# Patient Record
Sex: Female | Born: 1975
Health system: Southern US, Community
[De-identification: ages and names within clinical notes are randomized; demographics above are authoritative.]

## PROBLEM LIST (undated history)

## (undated) HISTORY — PX: NO PAST SURGERIES: SHX2092

---

## 2005-08-15 ENCOUNTER — Observation Stay: Payer: Self-pay

## 2005-08-18 ENCOUNTER — Inpatient Hospital Stay: Payer: Self-pay | Admitting: Certified Nurse Midwife

## 2006-12-29 ENCOUNTER — Ambulatory Visit: Payer: Self-pay

## 2007-08-11 ENCOUNTER — Observation Stay: Payer: Self-pay | Admitting: Obstetrics and Gynecology

## 2007-08-28 ENCOUNTER — Observation Stay: Payer: Self-pay | Admitting: Certified Nurse Midwife

## 2007-08-30 ENCOUNTER — Inpatient Hospital Stay: Payer: Self-pay

## 2008-04-11 ENCOUNTER — Ambulatory Visit: Payer: Self-pay | Admitting: Internal Medicine

## 2015-05-31 ENCOUNTER — Encounter: Payer: Self-pay | Admitting: Internal Medicine

## 2015-05-31 ENCOUNTER — Ambulatory Visit (INDEPENDENT_AMBULATORY_CARE_PROVIDER_SITE_OTHER): Payer: BLUE CROSS/BLUE SHIELD | Admitting: Internal Medicine

## 2015-05-31 VITALS — BP 102/58 | HR 81 | Temp 98.1°F | Ht 65.5 in | Wt 120.4 lb

## 2015-05-31 DIAGNOSIS — A499 Bacterial infection, unspecified: Secondary | ICD-10-CM | POA: Diagnosis not present

## 2015-05-31 DIAGNOSIS — G43009 Migraine without aura, not intractable, without status migrainosus: Secondary | ICD-10-CM | POA: Insufficient documentation

## 2015-05-31 DIAGNOSIS — N760 Acute vaginitis: Secondary | ICD-10-CM

## 2015-05-31 DIAGNOSIS — Z309 Encounter for contraceptive management, unspecified: Secondary | ICD-10-CM

## 2015-05-31 DIAGNOSIS — A6000 Herpesviral infection of urogenital system, unspecified: Secondary | ICD-10-CM

## 2015-05-31 DIAGNOSIS — A609 Anogenital herpesviral infection, unspecified: Secondary | ICD-10-CM

## 2015-05-31 DIAGNOSIS — L723 Sebaceous cyst: Secondary | ICD-10-CM | POA: Diagnosis not present

## 2015-05-31 DIAGNOSIS — E782 Mixed hyperlipidemia: Secondary | ICD-10-CM | POA: Insufficient documentation

## 2015-05-31 DIAGNOSIS — J019 Acute sinusitis, unspecified: Secondary | ICD-10-CM

## 2015-05-31 DIAGNOSIS — K219 Gastro-esophageal reflux disease without esophagitis: Secondary | ICD-10-CM | POA: Insufficient documentation

## 2015-05-31 DIAGNOSIS — B9689 Other specified bacterial agents as the cause of diseases classified elsewhere: Secondary | ICD-10-CM

## 2015-05-31 MED ORDER — AZITHROMYCIN 250 MG PO TABS: ORAL_TABLET | ORAL | Status: DC

## 2015-05-31 MED ORDER — NORGESTIMATE-ETH ESTRADIOL 0.25-35 MG-MCG PO TABS: 1.0000 | ORAL_TABLET | Freq: Every day | ORAL | Status: DC

## 2015-05-31 MED ORDER — METRONIDAZOLE 0.75 % VA GEL: 1.0000 | Freq: Two times a day (BID) | VAGINAL | Status: DC

## 2015-05-31 MED ORDER — VALACYCLOVIR HCL 1 G PO TABS: 1000.0000 mg | ORAL_TABLET | Freq: Every day | ORAL | Status: DC

## 2015-05-31 NOTE — Progress Notes (Signed)
Date:  05/31/2015   Name:  Candace Rangel   DOB:  September 29, 1975   MRN:  [id number]   Chief Complaint: Sore Throat Sore Throat  This is a new problem. The current episode started in the past 7 days. The problem has been unchanged. There has been no fever. Associated symptoms include congestion and headaches. Pertinent negatives include no abdominal pain or coughing. Exposure to: children have been sick with cold symptoms. She has tried acetaminophen for the symptoms.  Rash This is a chronic problem. The problem is unchanged. The affected locations include the scalp (large cyst that has been present for years). Associated symptoms include congestion and a sore throat. Pertinent negatives include no cough, fatigue or fever.  Vaginal Itching The patient's primary symptoms include genital itching. The patient's pertinent negatives include no genital lesions, genital rash or vaginal discharge. This is a new problem. The current episode started in the past 7 days. Associated symptoms include headaches, rash and a sore throat. Pertinent negatives include no abdominal pain, chills, dysuria or fever. Treatments tried: vagisil. The treatment provided no relief. She is sexually active. Her past medical history is significant for herpes simplex (but symptoms are not similar).  Contraception - patient was previously on birth control pills. She stopped it when she became separated however now is in a new relationship. She like to resume as again. In the interim her periods have been regular she has no complaints. She is a nonsmoker has no history of elevated blood pressure DVT or stroke.   Review of Systems  Constitutional: Negative for fever, chills and fatigue.  HENT: Positive for congestion, nosebleeds, sinus pressure and sore throat.   Respiratory: Negative for cough, choking and wheezing.   Cardiovascular: Negative for chest pain, palpitations and leg swelling.  Gastrointestinal: Negative for abdominal  pain.  Genitourinary: Positive for genital sores (generalized irritation, scant discharge and intermittent itching). Negative for dysuria, vaginal discharge, menstrual problem and dyspareunia.  Musculoskeletal: Negative for arthralgias.  Skin: Positive for rash.  Neurological: Positive for headaches. Negative for dizziness, syncope and light-headedness.    Patient Active Problem List   Diagnosis Date Noted  . Hyperlipidemia, mixed 05/31/2015  . Recurrent genital herpes 05/31/2015  . GERD (gastroesophageal reflux disease) 05/31/2015  . Migraine without aura and without status migrainosus, not intractable 05/31/2015    Prior to Admission medications   Not on File    No Known Allergies  Past Surgical History  Procedure Laterality Date  . No past surgeries      Social History  Substance Use Topics  . Smoking status: Never Smoker   . Smokeless tobacco: None  . Alcohol Use: Yes     Comment: rarely     Medication list has been reviewed and updated.   Physical Exam  Constitutional: She is oriented to person, place, and time. She appears well-developed. No distress.  HENT:  Head: Normocephalic and atraumatic.  Right Ear: Ear canal normal. Tympanic membrane is retracted.  Left Ear: Ear canal normal. Tympanic membrane is retracted.  Nose: Right sinus exhibits no maxillary sinus tenderness and no frontal sinus tenderness. Left sinus exhibits maxillary sinus tenderness. Left sinus exhibits no frontal sinus tenderness.  Mouth/Throat: Posterior oropharyngeal erythema present. No oropharyngeal exudate or posterior oropharyngeal edema.  Neck: Normal range of motion. Neck supple. No thyromegaly present.  Cardiovascular: Normal rate, regular rhythm and normal heart sounds.   Pulmonary/Chest: Effort normal and breath sounds normal. No respiratory distress.  Abdominal: Soft. Bowel sounds are  normal. She exhibits no mass. There is no tenderness. There is no rebound and no guarding.    Genitourinary:  deferred  Musculoskeletal: Normal range of motion. She exhibits no edema.  Neurological: She is alert and oriented to person, place, and time.  Skin: Skin is warm and dry. No rash noted.  .75 cm sebaceous cyst of the scalp over the left ear.  No pore, tenderness or fluctuance.  Fully mobile.  Psychiatric: She has a normal mood and affect. Her behavior is normal. Thought content normal.  Nursing note and vitals reviewed.   BP 102/58 mmHg  Pulse 81  Temp(Src) 98.1 F (36.7 C) (Oral)  Ht 5' 5.5" (1.664 m)  Wt 120 lb 6.4 oz (54.613 kg)  BMI 19.72 kg/m2  SpO2 97%  LMP 05/17/2015  Assessment and Plan: 1. Sebaceous cyst Patient is reassured; no treatment is needed  2. Recurrent genital herpes Resume therapy - valACYclovir (VALTREX) 1000 MG tablet; Take 1 tablet (1,000 mg total) by mouth daily.  Dispense: 30 tablet; Refill: 12  3. BV (bacterial vaginosis) Suspect BV so will treat with vaginal antibiotics If symptoms worsen may need pelvic and wet prep - metroNIDAZOLE (METROGEL) 0.75 % vaginal gel; Place 1 Applicatorful vaginally 2 (two) times daily.  Dispense: 70 g; Refill: 0  4. Acute sinusitis, recurrence not specified, unspecified location Use flonase for congestion Continue tylenol or advil for headache - azithromycin (ZITHROMAX Z-PAK) 250 MG tablet; Take 2 pills on day #1 then 1 pill daily for 4 more days  Dispense: 6 each; Refill: 0  5. Encounter for contraceptive management, unspecified encounter Resume OCP on the Sunday after next menses begins - norgestimate-ethinyl estradiol (ORTHO-CYCLEN,SPRINTEC,PREVIFEM) 0.25-35 MG-MCG tablet; Take 1 tablet by mouth daily.  Dispense: 1 Package; Refill: 11   Bari Edward, MD [facility]  05/31/2015

## 2015-05-31 NOTE — Patient Instructions (Signed)
Breast Self-Awareness Practicing breast self-awareness may pick up problems early, prevent significant medical complications, and possibly save your life. By practicing breast self-awareness, you can become familiar with how your breasts look and feel and if your breasts are changing. This allows you to notice changes early. It can also offer you some reassurance that your breast health is good. One way to learn what is normal for your breasts and whether your breasts are changing is to do a breast self-exam. If you find a lump or something that was not present in the past, it is best to contact your caregiver right away. Other findings that should be evaluated by your caregiver include nipple discharge, especially if it is bloody; skin changes or reddening; areas where the skin seems to be pulled in (retracted); or new lumps and bumps. Breast pain is seldom associated with cancer (malignancy), but should also be evaluated by a caregiver. HOW TO PERFORM A BREAST SELF-EXAM The best time to examine your breasts is 5-7 days after your menstrual period is over. During menstruation, the breasts are lumpier, and it may be more difficult to pick up changes. If you do not menstruate, have reached menopause, or had your uterus removed (hysterectomy), you should examine your breasts at regular intervals, such as monthly. If you are breastfeeding, examine your breasts after a feeding or after using a breast pump. Breast implants do not decrease the risk for lumps or tumors, so continue to perform breast self-exams as recommended. Talk to your caregiver about how to determine the difference between the implant and breast tissue. Also, talk about the amount of pressure you should use during the exam. Over time, you will become more familiar with the variations of your breasts and more comfortable with the exam. A breast self-exam requires you to remove all your clothes above the waist. 1. Look at your breasts and nipples.  Stand in front of a mirror in a room with good lighting. With your hands on your hips, push your hands firmly downward. Look for a difference in shape, contour, and size from one breast to the other (asymmetry). Asymmetry includes puckers, dips, or bumps. Also, look for skin changes, such as reddened or scaly areas on the breasts. Look for nipple changes, such as discharge, dimpling, repositioning, or redness. 2. Carefully feel your breasts. This is best done either in the shower or tub while using soapy water or when flat on your back. Place the arm (on the side of the breast you are examining) above your head. Use the pads (not the fingertips) of your three middle fingers on your opposite hand to feel your breasts. Start in the underarm area and use  inch (2 cm) overlapping circles to feel your breast. Use 3 different levels of pressure (light, medium, and firm pressure) at each circle before moving to the next circle. The light pressure is needed to feel the tissue closest to the skin. The medium pressure will help to feel breast tissue a little deeper, while the firm pressure is needed to feel the tissue close to the ribs. Continue the overlapping circles, moving downward over the breast until you feel your ribs below your breast. Then, move one finger-width towards the center of the body. Continue to use the  inch (2 cm) overlapping circles to feel your breast as you move slowly up toward the collar bone (clavicle) near the base of the neck. Continue the up and down exam using all 3 pressures until you reach the   middle of the chest. Do this with each breast, carefully feeling for lumps or changes. 3.  Keep a written record with breast changes or normal findings for each breast. By writing this information down, you do not need to depend only on memory for size, tenderness, or location. Write down where you are in your menstrual cycle, if you are still menstruating. Breast tissue can have some lumps or  thick tissue. However, see your caregiver if you find anything that concerns you.  SEEK MEDICAL CARE IF:  You see a change in shape, contour, or size of your breasts or nipples.   You see skin changes, such as reddened or scaly areas on the breasts or nipples.   You have an unusual discharge from your nipples.   You feel a new lump or unusually thick areas.    This information is not intended to replace advice given to you by your health care provider. Make sure you discuss any questions you have with your health care provider.   Document Released: 04/08/2005 Document Revised: 03/25/2012 Document Reviewed: 07/24/2011 Elsevier Interactive Patient Education 2016 [employer]  

## 2015-08-31 ENCOUNTER — Ambulatory Visit (INDEPENDENT_AMBULATORY_CARE_PROVIDER_SITE_OTHER): Payer: BLUE CROSS/BLUE SHIELD | Admitting: Internal Medicine

## 2015-08-31 ENCOUNTER — Encounter: Payer: Self-pay | Admitting: Internal Medicine

## 2015-08-31 VITALS — BP 110/78 | HR 74 | Temp 97.8°F | Resp 16 | Ht 65.5 in | Wt 128.0 lb

## 2015-08-31 DIAGNOSIS — R300 Dysuria: Secondary | ICD-10-CM | POA: Diagnosis not present

## 2015-08-31 LAB — POCT URINALYSIS DIPSTICK
BILIRUBIN UA: NEGATIVE
GLUCOSE UA: NEGATIVE
Ketones, UA: NEGATIVE
LEUKOCYTES UA: NEGATIVE
NITRITE UA: NEGATIVE
Protein, UA: NEGATIVE
Spec Grav, UA: 1.015
UROBILINOGEN UA: 0.2
pH, UA: 5

## 2015-08-31 MED ORDER — CIPROFLOXACIN HCL 250 MG PO TABS: 250.0000 mg | ORAL_TABLET | Freq: Two times a day (BID) | ORAL | Status: DC

## 2015-08-31 NOTE — Progress Notes (Signed)
    Date:  08/31/2015   Name:  Candace Rangel   DOB:  1976-03-03   MRN:  [id number]   Chief Complaint: Urinary Tract Infection Urinary Tract Infection  This is a new problem. The current episode started in the past 7 days. The problem occurs every urination. The problem has been waxing and waning. The quality of the pain is described as aching. There has been no fever. She is sexually active. There is no history of pyelonephritis. Associated symptoms include frequency, hematuria and urgency. Pertinent negatives include no chills, flank pain, sweats or vomiting. She has tried increased fluids for the symptoms. The treatment provided mild relief.      Review of Systems  Constitutional: Negative for chills.  Gastrointestinal: Negative for vomiting.  Genitourinary: Positive for urgency, frequency and hematuria. Negative for flank pain.    Patient Active Problem List   Diagnosis Date Noted  . Hyperlipidemia, mixed 05/31/2015  . Recurrent genital herpes 05/31/2015  . GERD (gastroesophageal reflux disease) 05/31/2015  . Migraine without aura and without status migrainosus, not intractable 05/31/2015    Prior to Admission medications   Medication Sig Start Date End Date Taking? Authorizing Provider  norgestimate-ethinyl estradiol (ORTHO-CYCLEN,SPRINTEC,PREVIFEM) 0.25-35 MG-MCG tablet Take 1 tablet by mouth daily. 05/31/15  Yes Reubin MilanLaura H Berglund, MD  valACYclovir (VALTREX) 1000 MG tablet Take 1 tablet (1,000 mg total) by mouth daily. 05/31/15  Yes Reubin MilanLaura H Berglund, MD  ciprofloxacin (CIPRO) 250 MG tablet Take 1 tablet (250 mg total) by mouth 2 (two) times daily. 08/31/15   Reubin MilanLaura H Berglund, MD    No Known Allergies  Past Surgical History  Procedure Laterality Date  . No past surgeries      Social History  Substance Use Topics  . Smoking status: Never Smoker   . Smokeless tobacco: None  . Alcohol Use: Yes     Comment: rarely     Medication list has been reviewed and  updated.   Physical Exam  Constitutional: No distress.  Cardiovascular: Normal rate, regular rhythm and normal heart sounds.   Pulmonary/Chest: Effort normal and breath sounds normal.  Abdominal: Soft. Bowel sounds are normal. There is tenderness (suprapubic). There is no CVA tenderness.  Nursing note and vitals reviewed.   BP 110/78 mmHg  Pulse 74  Temp(Src) 97.8 F (36.6 C) (Oral)  Resp 16  Ht 5' 5.5" (1.664 m)  Wt 128 lb (58.06 kg)  BMI 20.97 kg/m2  SpO2 99%  Assessment and Plan: 1. Dysuria - POCT urinalysis dipstick - ciprofloxacin (CIPRO) 250 MG tablet; Take 1 tablet (250 mg total) by mouth 2 (two) times daily.  Dispense: 10 tablet; Refill: 0   Bari EdwardLaura Berglund, MD [facility]  08/31/2015

## 2015-08-31 NOTE — Patient Instructions (Signed)

## 2015-12-11 ENCOUNTER — Encounter: Payer: Self-pay | Admitting: Internal Medicine

## 2015-12-11 ENCOUNTER — Ambulatory Visit (INDEPENDENT_AMBULATORY_CARE_PROVIDER_SITE_OTHER): Payer: BLUE CROSS/BLUE SHIELD | Admitting: Internal Medicine

## 2015-12-11 VITALS — BP 105/72 | HR 98 | Resp 16 | Ht 65.5 in | Wt 127.0 lb

## 2015-12-11 DIAGNOSIS — S336XXA Sprain of sacroiliac joint, initial encounter: Secondary | ICD-10-CM

## 2015-12-11 MED ORDER — CYCLOBENZAPRINE HCL 10 MG PO TABS: 10.0000 mg | ORAL_TABLET | Freq: Three times a day (TID) | ORAL | 0 refills | Status: DC | PRN

## 2015-12-11 NOTE — Progress Notes (Addendum)
    Date:  12/11/2015   Name:  Candace Rangel   DOB:  23-Sep-1975   MRN:  [id number]   Chief Complaint: Leg Pain (moved this weekend and pulled myuscle in left leg and back pulling bike up stairs. ) Pain in the left SI region last 4 days.  Unable to work yesterday.  She has some tingling in her left leg.  No weakness.  She is using Advil and heat.   Review of Systems  Constitutional: Negative for fatigue.  Respiratory: Negative for chest tightness and shortness of breath.   Cardiovascular: Negative for chest pain, palpitations and leg swelling.  Musculoskeletal: Positive for back pain. Negative for arthralgias, gait problem and joint swelling.  Skin: Negative for rash.  Psychiatric/Behavioral: Negative for sleep disturbance.    Patient Active Problem List   Diagnosis Date Noted  . Hyperlipidemia, mixed 05/31/2015  . Recurrent genital herpes 05/31/2015  . GERD (gastroesophageal reflux disease) 05/31/2015  . Migraine without aura and without status migrainosus, not intractable 05/31/2015    Prior to Admission medications   Medication Sig Start Date End Date Taking? Authorizing Provider  norgestimate-ethinyl estradiol (ORTHO-CYCLEN,SPRINTEC,PREVIFEM) 0.25-35 MG-MCG tablet Take 1 tablet by mouth daily. 05/31/15  Yes Reubin MilanLaura H Berglund, MD  valACYclovir (VALTREX) 1000 MG tablet Take 1 tablet (1,000 mg total) by mouth daily. 05/31/15  Yes Reubin MilanLaura H Berglund, MD    No Known Allergies  Past Surgical History:  Procedure Laterality Date  . NO PAST SURGERIES      Social History  Substance Use Topics  . Smoking status: Never Smoker  . Smokeless tobacco: Never Used  . Alcohol use Yes     Comment: rarely     Medication list has been reviewed and updated.   Physical Exam  Constitutional: She is oriented to person, place, and time. She appears well-developed and well-nourished. No distress.  HENT:  Head: Normocephalic and atraumatic.  Neck: Normal range of motion. Neck supple. No  thyromegaly present.  Cardiovascular: Normal rate, regular rhythm and normal heart sounds.   Pulmonary/Chest: Effort normal and breath sounds normal. No respiratory distress.  Musculoskeletal: She exhibits no edema.       Lumbar back: She exhibits decreased range of motion, tenderness and spasm.  Neurological: She is alert and oriented to person, place, and time. She has normal strength and normal reflexes. No sensory deficit.  Skin: Skin is warm and dry. No rash noted.  Psychiatric: She has a normal mood and affect. Her speech is normal and behavior is normal. Thought content normal.  Nursing note and vitals reviewed.   BP 105/72 (BP Location: Right Arm, Patient Position: Sitting, Cuff Size: Normal)   Pulse 98   Resp 16   Ht 5' 5.5" (1.664 m)   Wt 127 lb (57.6 kg)   SpO2 98%   BMI 20.81 kg/m   Assessment and Plan: 1. Sacroiliac (ligament) sprain, initial encounter Continue Advil and heat Avoid heavy lifting for several weeks. - cyclobenzaprine (FLEXERIL) 10 MG tablet; Take 1 tablet (10 mg total) by mouth 3 (three) times daily as needed for muscle spasms.  Dispense: 30 tablet; Refill: 0   Bari EdwardLaura Berglund, MD [facility]  12/11/2015

## 2016-02-29 ENCOUNTER — Ambulatory Visit
Admission: EM | Admit: 2016-02-29 | Discharge: 2016-02-29 | Disposition: A | Discharge status: Home / Self Care | Payer: BLUE CROSS/BLUE SHIELD | Attending: Emergency Medicine | Admitting: Emergency Medicine

## 2016-02-29 DIAGNOSIS — J40 Bronchitis, not specified as acute or chronic: Secondary | ICD-10-CM

## 2016-02-29 MED ORDER — BENZONATATE 100 MG PO CAPS: 100.0000 mg | ORAL_CAPSULE | Freq: Three times a day (TID) | ORAL | 0 refills | Status: DC | PRN

## 2016-02-29 MED ORDER — AZITHROMYCIN 250 MG PO TABS: ORAL_TABLET | ORAL | 0 refills | Status: DC

## 2016-02-29 MED ORDER — HYDROCOD POLST-CPM POLST ER 10-8 MG/5ML PO SUER: 5.0000 mL | Freq: Every evening | ORAL | 0 refills | Status: DC | PRN

## 2016-02-29 NOTE — Discharge Instructions (Signed)
Take medication as prescribed. Rest. Drink plenty of fluids.  ° °Follow up with your primary care physician this week as needed. Return to Urgent care for new or worsening concerns.  ° °

## 2016-02-29 NOTE — ED Triage Notes (Signed)
Pt c/o cough for about 2 weeks, she states it started out as nasal congestion then turned into coughing and its worse at night. She feels like she has a lump in her throat from all the mucus. Cough is non productive

## 2016-02-29 NOTE — ED Provider Notes (Signed)
[facility] ____________________________________________  Time seen: Approximately 11:26 AM  I have reviewed the triage vital signs and the nursing notes.   HISTORY  Chief Complaint Cough   HPI Johna Rolesdrianne McKenzie is a 40 y.o. female presents with complaints of 2 weeks of cough and congestion. Patient states that initially she started having sore throat, nasal congestion, sinus pressure and sinus drainage which has since moved down to more of a cough. Patient states that occasionally loses drainage but denies sinus pressure or copious nasal drainage. Patient reports cough is primarily a dry cough which is worse at night, but still continues that today. States occasionally productive but mostly dry. Reports her 2 sons recently sick with similar prior to her symptom onset. Reports she has tried multiple over-the-counter cough and congestion medications without any resolution. Denies known fevers. Reports continues to eat and drink well. Denies recent sickness. Denies wheezing.  Denies chest pain, shortness of breath, chest pain with deep breath, abdominal pain, dysuria, extremity pain or extremity swelling.  PCP: Judithann GravesBerglund Patient's last menstrual period was 02/21/2016. Denies pregnancy.   History reviewed. No pertinent past medical history.  Patient Active Problem List   Diagnosis Date Noted  . Hyperlipidemia, mixed 05/31/2015  . Recurrent genital herpes 05/31/2015  . GERD (gastroesophageal reflux disease) 05/31/2015  . Migraine without aura and without status migrainosus, not intractable 05/31/2015    Past Surgical History:  Procedure Laterality Date  . NO PAST SURGERIES      Current Outpatient Rx  . Order #: [id number] Class: Normal  . Order #: [id number] Class: Normal  . Order #: [id number] Class: Normal  . Order #: [id number] Class: Print    No current facility-administered medications for this encounter.   Current Outpatient Prescriptions:  .  valACYclovir  (VALTREX) 1000 MG tablet, Take 1 tablet (1,000 mg total) by mouth daily., Disp: 30 tablet, Rfl: 12 .  azithromycin (ZITHROMAX Z-PAK) 250 MG tablet, Take 2 tablets (500 mg) on  Day 1,  followed by 1 tablet (250 mg) once daily on Days 2 through 5., Disp: 6 each, Rfl: 0 .  benzonatate (TESSALON PERLES) 100 MG capsule, Take 1 capsule (100 mg total) by mouth 3 (three) times daily as needed., Disp: 15 capsule, Rfl: 0 .  chlorpheniramine-HYDROcodone (TUSSIONEX PENNKINETIC ER) 10-8 MG/5ML SUER, Take 5 mLs by mouth at bedtime as needed. do not drive or operate machinery while taking as can cause drowsiness., Disp: 100 mL, Rfl: 0  Allergies Patient has no known allergies.  Family History  Problem Relation Age of Onset  . Migraines Mother   . Hypertension Father     Social History Social History  Substance Use Topics  . Smoking status: Never Smoker  . Smokeless tobacco: Never Used  . Alcohol use Yes     Comment: rarely    Review of Systems Constitutional: No fever/chills Eyes: No visual changes. ENT: As above. Cardiovascular: Denies chest pain. Respiratory: Denies shortness of breath. Gastrointestinal: No abdominal pain.  No nausea, no vomiting.  No diarrhea.  No constipation. Genitourinary: Negative for dysuria. Musculoskeletal: Negative for back pain. Skin: Negative for rash. Neurological: Negative for headaches, focal weakness or numbness.  10-point ROS otherwise negative.  ____________________________________________   PHYSICAL EXAM:  VITAL SIGNS: ED Triage Vitals  Enc Vitals Group     BP 02/29/16 1121 115/83     Pulse Rate 02/29/16 1121 83     Resp 02/29/16 1121 18     Temp 02/29/16 1121 98 F (36.7 C)     Temp  Source 02/29/16 1121 Oral     SpO2 02/29/16 1121 99 %     Weight 02/29/16 1122 125 lb (56.7 kg)     Height 02/29/16 1122 5\' 5"  (1.651 m)     Head Circumference --      Peak Flow --      Pain Score 02/29/16 1124 0     Pain Loc --      Pain Edu? --      Excl.  in GC? --     Constitutional: Alert and oriented. Well appearing and in no acute distress. Eyes: Conjunctivae are normal. PERRL. EOMI. Head: Atraumatic. No sinus tenderness to palpation. No swelling. No erythema.  Ears: no erythema, normal TMs bilaterally.   Nose:No nasal congestion or rhinorrhea noted.  Mouth/Throat: Mucous membranes are moist. No pharyngeal erythema. No tonsillar swelling or exudate.  Neck: No stridor.  No cervical spine tenderness to palpation. Hematological/Lymphatic/Immunilogical: No cervical lymphadenopathy. Cardiovascular: Normal rate, regular rhythm. Grossly normal heart sounds.  Good peripheral circulation. Respiratory: Normal respiratory effort.  No retractions. Lungs CTAB. No wheezes, rales or rhonchi. Dry intermittent cough noted in room. Speaks in complete sentences. Good air movement.  Gastrointestinal: Soft and nontender. No CVA tenderness. Musculoskeletal: Ambulatory with steady gait. No cervical, thoracic or lumbar tenderness to palpation. Neurologic:  Normal speech and language. No gross focal neurologic deficits are appreciated. No gait instability. Skin:  Skin is warm, dry and intact. No rash noted. Psychiatric: Mood and affect are normal. Speech and behavior are normal.  ___________________________________________   LABS (all labs ordered are listed, but only abnormal results are displayed)  Labs Reviewed - No data to display ____________________________________________    PROCEDURES Procedures   INITIAL IMPRESSION / ASSESSMENT AND PLAN / ED COURSE  Pertinent labs & imaging results that were available during my care of the patient were reviewed by me and considered in my medical decision making (see chart for details).  Overall well-appearing patient. No acute distress. Cough and congestion 2 weeks now primary with cough and chest congestion. Suspect bronchitis. No focal area of consolidation auscultated. Will defer x-ray. Will treat patient  empirically with oral azithromycin, when necessary Tussionex at night and when necessary Tessalon Perles during the day. Encouraged rest, fluids and supportive care. Discussed indication, risks and benefits of medications with patient.  Discussed follow up with Primary care physician this week. Discussed follow up and return parameters including no resolution or any worsening concerns. Patient verbalized understanding and agreed to plan.   ____________________________________________   FINAL CLINICAL IMPRESSION(S) / ED DIAGNOSES  Final diagnoses:  Bronchitis     Discharge Medication List as of 02/29/2016 11:46 AM    START taking these medications   Details  azithromycin (ZITHROMAX Z-PAK) 250 MG tablet Take 2 tablets (500 mg) on  Day 1,  followed by 1 tablet (250 mg) once daily on Days 2 through 5., Normal    benzonatate (TESSALON PERLES) 100 MG capsule Take 1 capsule (100 mg total) by mouth 3 (three) times daily as needed., Starting Thu 02/29/2016, Normal    chlorpheniramine-HYDROcodone (TUSSIONEX PENNKINETIC ER) 10-8 MG/5ML SUER Take 5 mLs by mouth at bedtime as needed. do not drive or operate machinery while taking as can cause drowsiness., Starting Thu 02/29/2016, Print        Note: This dictation was prepared with Dragon dictation along with smaller phrase technology. Any transcriptional errors that result from this process are unintentional.    Clinical Course       Mardella Layman  Hyacinth MeekerMiller, NP 02/29/16 1205

## 2016-03-31 ENCOUNTER — Ambulatory Visit (INDEPENDENT_AMBULATORY_CARE_PROVIDER_SITE_OTHER): Payer: BLUE CROSS/BLUE SHIELD

## 2016-03-31 ENCOUNTER — Ambulatory Visit
Admission: EM | Admit: 2016-03-31 | Discharge: 2016-03-31 | Disposition: A | Discharge status: Home / Self Care | Payer: BLUE CROSS/BLUE SHIELD | Attending: Family Medicine | Admitting: Family Medicine

## 2016-03-31 DIAGNOSIS — S8992XA Unspecified injury of left lower leg, initial encounter: Secondary | ICD-10-CM | POA: Diagnosis not present

## 2016-03-31 DIAGNOSIS — M25562 Pain in left knee: Secondary | ICD-10-CM

## 2016-03-31 MED ORDER — NAPROXEN 500 MG PO TABS: 500.0000 mg | ORAL_TABLET | Freq: Two times a day (BID) | ORAL | 0 refills | Status: DC

## 2016-03-31 NOTE — ED Triage Notes (Signed)
Patient complains of left knee pain. Patient states that she was sledding last night and stuck her leg out. Patient states that leg went back behind her and she felt a pop in her knee. Patient states that she has pain with bending or with applying weight.

## 2016-03-31 NOTE — ED Provider Notes (Signed)
CSN: [id number]     Arrival date & time 03/31/16  1203 History   First MD Initiated Contact with Patient 03/31/16 1319     Chief Complaint  Patient presents with  . Knee Pain    Left   (Consider location/radiation/quality/duration/timing/severity/associated sxs/prior Treatment) Pt was sledding yesterday and her lt leg slid under her and she heard a pop and pain to anterior knee area. Able to extend but pain on extension. Has not taken anything for pain pt is asking for an x ray. Small dime size bruise to area no obvious deformity.       History reviewed. No pertinent past medical history. Past Surgical History:  Procedure Laterality Date  . NO PAST SURGERIES     Family History  Problem Relation Age of Onset  . Migraines Mother   . Hypertension Father    Social History  Substance Use Topics  . Smoking status: Never Smoker  . Smokeless tobacco: Never Used  . Alcohol use Yes     Comment: rarely   OB History    No data available     Review of Systems  Constitutional: Negative.   Respiratory: Negative.   Cardiovascular: Negative.   Musculoskeletal: Positive for joint swelling.       Lt knee pain   Skin:       Small dime size bruise to anterior knee   Neurological: Negative.     Allergies  Patient has no known allergies.  Home Medications   Prior to Admission medications   Medication Sig Start Date End Date Taking? Authorizing Provider  valACYclovir (VALTREX) 1000 MG tablet Take 1 tablet (1,000 mg total) by mouth daily. 05/31/15  Yes Reubin MilanLaura H Berglund, MD  azithromycin (ZITHROMAX Z-PAK) 250 MG tablet Take 2 tablets (500 mg) on  Day 1,  followed by 1 tablet (250 mg) once daily on Days 2 through 5. 02/29/16   Renford DillsLindsey Miller, NP  benzonatate (TESSALON PERLES) 100 MG capsule Take 1 capsule (100 mg total) by mouth 3 (three) times daily as needed. 02/29/16   Renford DillsLindsey Miller, NP  chlorpheniramine-HYDROcodone [facility](TUSSIONEX PENNKINETIC ER) 10-8 MG/5ML SUER Take 5 mLs by mouth at  bedtime as needed. do not drive or operate machinery while taking as can cause drowsiness. 02/29/16   Renford DillsLindsey Miller, NP  naproxen (NAPROSYN) 500 MG tablet Take 1 tablet (500 mg total) by mouth 2 (two) times daily. 03/31/16   Tobi BastosMelanie A Margia Wiesen, NP   Meds Ordered and Administered this Visit  Medications - No data to display  BP 124/65 (BP Location: Left Arm)   Pulse 85   Temp 99.2 F (37.3 C) (Oral)   Resp 16   Ht 5\' 5"  (1.651 m)   Wt 125 lb (56.7 kg)   LMP 03/25/2016   SpO2 100%   BMI 20.80 kg/m  No data found.   Physical Exam  Constitutional: She appears well-developed.  Cardiovascular: Normal rate and regular rhythm.   Pulmonary/Chest: Effort normal and breath sounds normal.  Musculoskeletal: She exhibits tenderness.  Pain upon flexion of lt knee able to extend . strong pulses, warm to touch   Skin: Skin is warm. Capillary refill takes less than 2 seconds.    Urgent Care Course   Clinical Course     Procedures (including critical care time)  Labs Review Labs Reviewed - No data to display  Imaging Review Dg Knee Complete 4 Views Left  Result Date: 03/31/2016 CLINICAL DATA:  Injury while sledding yesterday, crashed injuring medial LEFT knee,  pain with weight-bearing EXAM: LEFT KNEE - COMPLETE 4+ VIEW COMPARISON:  None FINDINGS: Osseous mineralization normal. Joint spaces preserved. Small bone island medial femoral condyles. No acute fracture, dislocation, or bone destruction. No knee joint effusion. IMPRESSION: No acute osseous abnormalities. Electronically Signed   By: Ulyses SouthwardMark  Boles M.D.   On: 03/31/2016 13:49             MDM   1. Acute pain of left knee   Wear knee immobilizer when walking for a few days then begin to have full mobility  Take pain meds as needed If not better may need to see ortho  We discussed pts x ray     Tobi BastosMelanie A Trevonne Nyland, NP 03/31/16 1415

## 2016-04-03 ENCOUNTER — Telehealth: Payer: Self-pay

## 2016-04-03 NOTE — Telephone Encounter (Signed)
Courtesy call back completed today for patient's recent visit at [facility]. Patient did not answer, left message on machine to call back with any questions or concerns.   

## 2016-04-04 DIAGNOSIS — M25362 Other instability, left knee: Secondary | ICD-10-CM | POA: Diagnosis not present

## 2016-04-04 DIAGNOSIS — S8392XA Sprain of unspecified site of left knee, initial encounter: Secondary | ICD-10-CM | POA: Diagnosis not present

## 2016-04-04 DIAGNOSIS — M238X2 Other internal derangements of left knee: Secondary | ICD-10-CM | POA: Diagnosis not present

## 2016-04-05 ENCOUNTER — Other Ambulatory Visit: Payer: Self-pay | Admitting: Orthopedic Surgery

## 2016-04-05 DIAGNOSIS — S8392XA Sprain of unspecified site of left knee, initial encounter: Secondary | ICD-10-CM

## 2016-04-05 DIAGNOSIS — M238X2 Other internal derangements of left knee: Secondary | ICD-10-CM

## 2016-04-05 DIAGNOSIS — M25362 Other instability, left knee: Secondary | ICD-10-CM

## 2016-04-18 ENCOUNTER — Ambulatory Visit
Admission: RE | Admit: 2016-04-18 | Discharge: 2016-04-18 | Disposition: A | Discharge status: Home / Self Care | Payer: BLUE CROSS/BLUE SHIELD | Source: Ambulatory Visit | Attending: Orthopedic Surgery | Admitting: Orthopedic Surgery

## 2016-04-18 DIAGNOSIS — X58XXXA Exposure to other specified factors, initial encounter: Secondary | ICD-10-CM | POA: Insufficient documentation

## 2016-04-18 DIAGNOSIS — S8392XA Sprain of unspecified site of left knee, initial encounter: Secondary | ICD-10-CM

## 2016-04-18 DIAGNOSIS — M25562 Pain in left knee: Secondary | ICD-10-CM | POA: Diagnosis not present

## 2016-04-18 DIAGNOSIS — S83282A Other tear of lateral meniscus, current injury, left knee, initial encounter: Secondary | ICD-10-CM | POA: Insufficient documentation

## 2016-04-18 DIAGNOSIS — M25362 Other instability, left knee: Secondary | ICD-10-CM

## 2016-04-18 DIAGNOSIS — M238X2 Other internal derangements of left knee: Secondary | ICD-10-CM

## 2016-05-22 DIAGNOSIS — S83412A Sprain of medial collateral ligament of left knee, initial encounter: Secondary | ICD-10-CM | POA: Diagnosis not present

## 2016-05-22 DIAGNOSIS — S83512A Sprain of anterior cruciate ligament of left knee, initial encounter: Secondary | ICD-10-CM | POA: Insufficient documentation

## 2016-05-22 HISTORY — DX: Sprain of anterior cruciate ligament of left knee, initial encounter: S83.512A

## 2016-05-30 DIAGNOSIS — M25562 Pain in left knee: Secondary | ICD-10-CM | POA: Diagnosis not present

## 2016-05-30 DIAGNOSIS — S86912A Strain of unspecified muscle(s) and tendon(s) at lower leg level, left leg, initial encounter: Secondary | ICD-10-CM | POA: Diagnosis not present

## 2016-05-30 DIAGNOSIS — G8929 Other chronic pain: Secondary | ICD-10-CM | POA: Diagnosis not present

## 2016-05-30 DIAGNOSIS — M6281 Muscle weakness (generalized): Secondary | ICD-10-CM | POA: Diagnosis not present

## 2016-06-06 DIAGNOSIS — S86912A Strain of unspecified muscle(s) and tendon(s) at lower leg level, left leg, initial encounter: Secondary | ICD-10-CM | POA: Diagnosis not present

## 2016-06-06 DIAGNOSIS — M25562 Pain in left knee: Secondary | ICD-10-CM | POA: Diagnosis not present

## 2016-06-06 DIAGNOSIS — G8929 Other chronic pain: Secondary | ICD-10-CM | POA: Diagnosis not present

## 2016-06-06 DIAGNOSIS — M6281 Muscle weakness (generalized): Secondary | ICD-10-CM | POA: Diagnosis not present

## 2016-06-11 DIAGNOSIS — M6281 Muscle weakness (generalized): Secondary | ICD-10-CM | POA: Diagnosis not present

## 2016-06-11 DIAGNOSIS — G8929 Other chronic pain: Secondary | ICD-10-CM | POA: Diagnosis not present

## 2016-06-11 DIAGNOSIS — M25562 Pain in left knee: Secondary | ICD-10-CM | POA: Diagnosis not present

## 2016-06-11 DIAGNOSIS — S86912A Strain of unspecified muscle(s) and tendon(s) at lower leg level, left leg, initial encounter: Secondary | ICD-10-CM | POA: Diagnosis not present

## 2016-06-20 DIAGNOSIS — S86912A Strain of unspecified muscle(s) and tendon(s) at lower leg level, left leg, initial encounter: Secondary | ICD-10-CM | POA: Diagnosis not present

## 2016-06-20 DIAGNOSIS — G8929 Other chronic pain: Secondary | ICD-10-CM | POA: Diagnosis not present

## 2016-06-20 DIAGNOSIS — M6281 Muscle weakness (generalized): Secondary | ICD-10-CM | POA: Diagnosis not present

## 2016-06-20 DIAGNOSIS — M25562 Pain in left knee: Secondary | ICD-10-CM | POA: Diagnosis not present

## 2016-06-27 DIAGNOSIS — G8929 Other chronic pain: Secondary | ICD-10-CM | POA: Diagnosis not present

## 2016-06-27 DIAGNOSIS — M25562 Pain in left knee: Secondary | ICD-10-CM | POA: Diagnosis not present

## 2016-06-27 DIAGNOSIS — S86912A Strain of unspecified muscle(s) and tendon(s) at lower leg level, left leg, initial encounter: Secondary | ICD-10-CM | POA: Diagnosis not present

## 2016-06-27 DIAGNOSIS — M6281 Muscle weakness (generalized): Secondary | ICD-10-CM | POA: Diagnosis not present

## 2016-07-03 DIAGNOSIS — S83512D Sprain of anterior cruciate ligament of left knee, subsequent encounter: Secondary | ICD-10-CM | POA: Diagnosis not present

## 2016-07-03 DIAGNOSIS — S83412D Sprain of medial collateral ligament of left knee, subsequent encounter: Secondary | ICD-10-CM | POA: Diagnosis not present

## 2016-11-06 ENCOUNTER — Other Ambulatory Visit: Payer: Self-pay | Admitting: Internal Medicine

## 2016-11-06 ENCOUNTER — Encounter: Payer: Self-pay | Admitting: Internal Medicine

## 2016-11-06 ENCOUNTER — Ambulatory Visit (INDEPENDENT_AMBULATORY_CARE_PROVIDER_SITE_OTHER): Payer: BLUE CROSS/BLUE SHIELD | Admitting: Internal Medicine

## 2016-11-06 VITALS — BP 112/62 | HR 76 | Ht 65.5 in | Wt 129.0 lb

## 2016-11-06 DIAGNOSIS — R102 Pelvic and perineal pain: Secondary | ICD-10-CM | POA: Diagnosis not present

## 2016-11-06 DIAGNOSIS — N3001 Acute cystitis with hematuria: Secondary | ICD-10-CM | POA: Diagnosis not present

## 2016-11-06 LAB — POC URINALYSIS WITH MICROSCOPIC (NON AUTO)MANUAL RESULT
BILIRUBIN UA: NEGATIVE
Crystals: 0
EPITHELIAL CELLS, URINE PER MICROSCOPY: 1
GLUCOSE UA: NEGATIVE
Ketones, UA: NEGATIVE
Mucus, UA: 0
Nitrite, UA: NEGATIVE
Protein, UA: NEGATIVE
RBC: 2 M/uL — AB (ref 4.04–5.48)
Spec Grav, UA: 1.01 (ref 1.010–1.025)
UROBILINOGEN UA: 0.2 U/dL
WBC Casts, UA: 10
pH, UA: 6.5 (ref 5.0–8.0)

## 2016-11-06 MED ORDER — CIPROFLOXACIN HCL 250 MG PO TABS: 250.0000 mg | ORAL_TABLET | Freq: Two times a day (BID) | ORAL | 0 refills | Status: AC

## 2016-11-06 NOTE — Progress Notes (Signed)
Date:  11/06/2016   Name:  Candace Rangel   DOB:  19-Aug-1975   MRN:  [id number]   Chief Complaint: Pelvic Pain (Pain started today- hurts like cramping. - urine is foul smelled. ) Pelvic Pain  The patient's primary symptoms include pelvic pain (pressure in lower abdomen). This is a new problem. The current episode started in the past 7 days. The problem occurs constantly. The pain is mild. The problem affects both sides. She is not pregnant. Associated symptoms include diarrhea. Pertinent negatives include no abdominal pain, back pain, chills, fever, flank pain, frequency or urgency.     Review of Systems  Constitutional: Negative for chills, fatigue and fever.  Respiratory: Negative for shortness of breath.   Cardiovascular: Negative for chest pain.  Gastrointestinal: Positive for diarrhea. Negative for abdominal pain.  Genitourinary: Positive for pelvic pain (pressure in lower abdomen). Negative for flank pain, frequency and urgency.  Musculoskeletal: Negative for arthralgias and back pain.    Patient Active Problem List   Diagnosis Date Noted  . Sprain of anterior cruciate ligament of left knee 05/22/2016  . Hyperlipidemia, mixed 05/31/2015  . Recurrent genital herpes 05/31/2015  . GERD (gastroesophageal reflux disease) 05/31/2015  . Migraine without aura and without status migrainosus, not intractable 05/31/2015    Prior to Admission medications   Medication Sig Start Date End Date Taking? Authorizing Provider  naproxen (NAPROSYN) 500 MG tablet Take 1 tablet (500 mg total) by mouth 2 (two) times daily. 03/31/16  Yes Maple MirzaMitchell, Melanie A, NP  valACYclovir (VALTREX) 1000 MG tablet Take 1 tablet (1,000 mg total) by mouth daily. 05/31/15  Yes Reubin MilanBerglund, Alayssa Flinchum H, MD    No Known Allergies  Past Surgical History:  Procedure Laterality Date  . NO PAST SURGERIES      Social History  Substance Use Topics  . Smoking status: Never Smoker  . Smokeless tobacco: Never  Used  . Alcohol use Yes     Comment: rarely     Medication list has been reviewed and updated.   Physical Exam  Constitutional: She is oriented to person, place, and time. She appears well-developed. No distress.  HENT:  Head: Normocephalic and atraumatic.  Cardiovascular: Normal rate, regular rhythm and normal heart sounds.   Pulmonary/Chest: Effort normal and breath sounds normal. No respiratory distress. She has no wheezes.  Abdominal: Soft. Normal appearance and bowel sounds are normal. She exhibits no mass. There is tenderness in the suprapubic area. There is no rigidity, no rebound and no guarding.  Musculoskeletal: Normal range of motion.  Neurological: She is alert and oriented to person, place, and time.  Skin: Skin is warm and dry. No rash noted.  Psychiatric: She has a normal mood and affect. Her behavior is normal. Thought content normal.  Nursing note and vitals reviewed.  Urine dipstick shows positive for WBC's, positive for RBC's and positive for leukocytes.  Micro exam: 10 WBC's per HPF.   BP 112/62   Pulse 76   Ht 5' 5.5" (1.664 m)   Wt 129 lb (58.5 kg)   LMP 10/20/2016   SpO2 99%   BMI 21.14 kg/m   Assessment and Plan: 1. Pelvic pain Suspect UTI - POC urinalysis w microscopic (non auto)  2. Acute cystitis with hematuria Will treat with Cipro - ciprofloxacin (CIPRO) 250 MG tablet; Take 1 tablet (250 mg total) by mouth 2 (two) times daily.  Dispense: 10 tablet; Refill: 0   Meds ordered this encounter  Medications  . ciprofloxacin (  CIPRO) 250 MG tablet    Sig: Take 1 tablet (250 mg total) by mouth 2 (two) times daily.    Dispense:  10 tablet    Refill:  0    Bari Edward, MD [facility]  11/06/2016

## 2016-11-06 NOTE — Patient Instructions (Signed)

## 2016-12-17 ENCOUNTER — Other Ambulatory Visit: Payer: Self-pay

## 2016-12-17 DIAGNOSIS — A6000 Herpesviral infection of urogenital system, unspecified: Secondary | ICD-10-CM

## 2016-12-17 MED ORDER — VALACYCLOVIR HCL 1 G PO TABS: 1000.0000 mg | ORAL_TABLET | Freq: Every day | ORAL | 12 refills | Status: DC

## 2017-03-04 ENCOUNTER — Encounter: Payer: Self-pay | Admitting: Internal Medicine

## 2017-03-04 ENCOUNTER — Ambulatory Visit: Payer: BLUE CROSS/BLUE SHIELD | Admitting: Internal Medicine

## 2017-03-04 VITALS — BP 124/80 | HR 75 | Ht 65.5 in | Wt 135.0 lb

## 2017-03-04 DIAGNOSIS — N3000 Acute cystitis without hematuria: Secondary | ICD-10-CM | POA: Diagnosis not present

## 2017-03-04 LAB — POCT URINALYSIS DIPSTICK
Bilirubin, UA: NEGATIVE
Glucose, UA: NEGATIVE
Ketones, UA: NEGATIVE
Nitrite, UA: POSITIVE
Protein, UA: NEGATIVE
Spec Grav, UA: 1.015 (ref 1.010–1.025)
Urobilinogen, UA: 0.2 E.U./dL
pH, UA: 6 (ref 5.0–8.0)

## 2017-03-04 MED ORDER — NITROFURANTOIN MONOHYD MACRO 100 MG PO CAPS: 100.0000 mg | ORAL_CAPSULE | Freq: Two times a day (BID) | ORAL | 0 refills | Status: AC

## 2017-03-04 NOTE — Progress Notes (Signed)
Date:  03/04/2017   Name:  Candace Rangel   DOB:  [birth year]   MRN:  [id number]   Chief Complaint: Urinary Tract Infection (Started Sunday night- pain in lower pelvic region. Urgency, and burning when urinating. )  Urinary Tract Infection   This is a new problem. The current episode started in the past 7 days. The problem has been gradually worsening. The quality of the pain is described as burning. The pain is mild. There has been no fever. She is sexually active. There is no history of pyelonephritis. Associated symptoms include frequency and urgency. Pertinent negatives include no chills, flank pain, hematuria, nausea or vomiting.     Review of Systems  Constitutional: Negative for chills and fever.  Respiratory: Negative for shortness of breath.   Cardiovascular: Negative for chest pain.  Gastrointestinal: Negative for nausea and vomiting.  Genitourinary: Positive for dysuria, frequency and urgency. Negative for flank pain and hematuria.  Skin: Negative for rash.  Neurological: Negative for dizziness and headaches.    Patient Active Problem List   Diagnosis Date Noted  . Sprain of anterior cruciate ligament of left knee 05/22/2016  . Hyperlipidemia, mixed 05/31/2015  . Recurrent genital herpes 05/31/2015  . GERD (gastroesophageal reflux disease) 05/31/2015  . Migraine without aura and without status migrainosus, not intractable 05/31/2015    Prior to Admission medications   Medication Sig Start Date End Date Taking? Authorizing Provider  naproxen (NAPROSYN) 500 MG tablet Take 1 tablet (500 mg total) by mouth 2 (two) times daily. 03/31/16  Yes Coralyn MarkMitchell, Melanie L, NP  valACYclovir (VALTREX) 1000 MG tablet Take 1 tablet (1,000 mg total) by mouth daily. 12/17/16  Yes Reubin MilanBerglund, Kayhan Boardley H, MD    No Known Allergies  Past Surgical History:  Procedure Laterality Date  . NO PAST SURGERIES      Social History   Tobacco Use  . Smoking status: Never Smoker  .  Smokeless tobacco: Never Used  Substance Use Topics  . Alcohol use: Yes    Comment: rarely  . Drug use: No     Medication list has been reviewed and updated.  PHQ 2/9 Scores 03/04/2017 08/31/2015  PHQ - 2 Score 0 0    Physical Exam  Constitutional: She appears well-developed and well-nourished.  Cardiovascular: Normal rate, regular rhythm and normal heart sounds.  Pulmonary/Chest: Effort normal and breath sounds normal. No respiratory distress.  Abdominal: Soft. Bowel sounds are normal. There is tenderness in the suprapubic area. There is no rebound, no guarding and no CVA tenderness.  Psychiatric: She has a normal mood and affect.  Nursing note and vitals reviewed.  Urine dipstick shows positive for RBC's, positive for nitrates and positive for leukocytes.  Micro exam: not done.   BP 124/80   Pulse 75   Ht 5' 5.5" (1.664 m)   Wt 135 lb (61.2 kg)   LMP 02/12/2017 (Exact Date)   SpO2 98%   BMI 22.12 kg/m   Assessment and Plan: 1. Acute cystitis without hematuria Continue to increase fluids - POCT urinalysis dipstick - nitrofurantoin, macrocrystal-monohydrate, (MACROBID) 100 MG capsule; Take 1 capsule (100 mg total) 2 (two) times daily for 7 days by mouth.  Dispense: 14 capsule; Refill: 0   Meds ordered this encounter  Medications  . nitrofurantoin, macrocrystal-monohydrate, (MACROBID) 100 MG capsule    Sig: Take 1 capsule (100 mg total) 2 (two) times daily for 7 days by mouth.    Dispense:  14 capsule    Refill:  0    Partially dictated using Animal nutritionistDragon software. Any errors are unintentional.  Bari EdwardLaura Lessly Stigler, MD [facility]  03/04/2017

## 2017-05-23 ENCOUNTER — Telehealth: Payer: Self-pay

## 2017-05-23 NOTE — Telephone Encounter (Signed)
Patient called stating she would like to begin taking depo injections for contraceptions. Explained to her we would need to see her for OV first to discuss this. Then we can send medication to pharmacy and she come back with medication for Nurse visit to have pregnancy test and then I can administer the shot for the first time. She verbalized understanding. Stated her period went off yesterday and then started back up today. Informed her it can be normal. Intercourse, stress, many things can play a role. She is in no pain just was concerned she is typically regular. Informed her we will see her to discuss depo and do pregnancy test when medication comes in.  She verbalized understanding. Sent to front to make OV.

## 2017-05-27 ENCOUNTER — Ambulatory Visit (INDEPENDENT_AMBULATORY_CARE_PROVIDER_SITE_OTHER): Payer: BLUE CROSS/BLUE SHIELD | Admitting: Internal Medicine

## 2017-05-27 ENCOUNTER — Encounter: Payer: Self-pay | Admitting: Internal Medicine

## 2017-05-27 VITALS — BP 118/68 | HR 71 | Ht 65.5 in | Wt 137.0 lb

## 2017-05-27 DIAGNOSIS — Z3009 Encounter for other general counseling and advice on contraception: Secondary | ICD-10-CM | POA: Diagnosis not present

## 2017-05-27 MED ORDER — MEDROXYPROGESTERONE ACETATE 150 MG/ML IM SUSY: 0.9000 mL | PREFILLED_SYRINGE | Freq: Once | INTRAMUSCULAR | 0 refills | Status: DC

## 2017-05-27 NOTE — Patient Instructions (Signed)
Make an appt with the Nurse for Depo injection about 5 days after your next period starts.

## 2017-05-27 NOTE — Progress Notes (Signed)
Date:  05/27/2017   Name:  Candace Rangel   DOB:  May 01, 1975   MRN:  [id number]   Chief Complaint: Contraception (Would like to discuss started DEPO. ) HPI She has been using condoms for birth control.  She has 2 children and does not desire any more.  She is engaged, her fiancee has one child and does not desire any more. She does not smoke.  No history of blood clots or DVT.  She would consider BLT but is very anxious about surgery.    Review of Systems  Constitutional: Negative for chills, fatigue, fever and unexpected weight change.  Respiratory: Negative for chest tightness and shortness of breath.   Cardiovascular: Negative for chest pain, palpitations and leg swelling.  Gastrointestinal: Negative for abdominal pain.  Genitourinary: Negative for dysuria, menstrual problem and pelvic pain.  Musculoskeletal: Negative for arthralgias.  Neurological: Negative for dizziness and headaches.  Psychiatric/Behavioral: Negative for dysphoric mood. The patient is not nervous/anxious.     Patient Active Problem List   Diagnosis Date Noted  . Sprain of anterior cruciate ligament of left knee 05/22/2016  . Hyperlipidemia, mixed 05/31/2015  . Recurrent genital herpes 05/31/2015  . GERD (gastroesophageal reflux disease) 05/31/2015  . Migraine without aura and without status migrainosus, not intractable 05/31/2015    Prior to Admission medications   Medication Sig Start Date End Date Taking? Authorizing Provider  naproxen (NAPROSYN) 500 MG tablet Take 1 tablet (500 mg total) by mouth 2 (two) times daily. 03/31/16  Yes Coralyn MarkMitchell, Melanie L, NP  valACYclovir (VALTREX) 1000 MG tablet Take 1 tablet (1,000 mg total) by mouth daily. 12/17/16  Yes Reubin MilanBerglund, Mava Suares H, MD    Allergies  Allergen Reactions  . Ciprofloxacin Other (See Comments)    headache    Past Surgical History:  Procedure Laterality Date  . NO PAST SURGERIES      Social History   Tobacco Use  . Smoking  status: Never Smoker  . Smokeless tobacco: Never Used  Substance Use Topics  . Alcohol use: Yes    Comment: rarely  . Drug use: No     Medication list has been reviewed and updated.  PHQ 2/9 Scores 03/04/2017 08/31/2015  PHQ - 2 Score 0 0    Physical Exam  Constitutional: She is oriented to person, place, and time. She appears well-developed. No distress.  HENT:  Head: Normocephalic and atraumatic.  Neck: Normal range of motion. Neck supple. No thyromegaly present.  Cardiovascular: Normal rate, regular rhythm and normal heart sounds.  Pulmonary/Chest: Effort normal and breath sounds normal. No respiratory distress. She has no wheezes.  Musculoskeletal: Normal range of motion. She exhibits no edema or tenderness.  Neurological: She is alert and oriented to person, place, and time.  Skin: Skin is warm and dry. No rash noted.  Psychiatric: She has a normal mood and affect. Her behavior is normal. Thought content normal.  Nursing note and vitals reviewed.   BP 118/68   Pulse 71   Ht 5' 5.5" (1.664 m)   Wt 137 lb (62.1 kg)   LMP 05/22/2017 (LMP Unknown)   SpO2 100%   BMI 22.45 kg/m   Assessment and Plan: 1. Encounter for counseling regarding contraception Call for Nurse visit to get injection 5 days after her next menses begins Would also recommend urine pregnancy at that time - MedroxyPROGESTERone Acetate 150 MG/ML SUSY; Inject 0.9 mLs (135 mg total) into the muscle once for 1 dose.  Dispense: 0.9 mL; Refill:  0   Meds ordered this encounter  Medications  . MedroxyPROGESTERone Acetate 150 MG/ML SUSY    Sig: Inject 0.9 mLs (135 mg total) into the muscle once for 1 dose.    Dispense:  0.9 mL    Refill:  0    Partially dictated using Animal nutritionist. Any errors are unintentional.  Bari Edward, MD [facility]  05/27/2017

## 2017-06-20 ENCOUNTER — Ambulatory Visit (INDEPENDENT_AMBULATORY_CARE_PROVIDER_SITE_OTHER): Payer: BLUE CROSS/BLUE SHIELD

## 2017-06-20 DIAGNOSIS — Z3042 Encounter for surveillance of injectable contraceptive: Secondary | ICD-10-CM | POA: Diagnosis not present

## 2017-06-20 LAB — POCT URINE PREGNANCY: Preg Test, Ur: NEGATIVE

## 2017-06-20 MED ORDER — MEDROXYPROGESTERONE ACETATE 150 MG/ML IM SUSP
150.0000 mg | Freq: Once | INTRAMUSCULAR | Status: AC
  Administered 2017-06-20: 150 mg via INTRAMUSCULAR

## 2017-07-30 ENCOUNTER — Encounter: Payer: Self-pay | Admitting: Internal Medicine

## 2017-07-30 ENCOUNTER — Ambulatory Visit (INDEPENDENT_AMBULATORY_CARE_PROVIDER_SITE_OTHER): Payer: BLUE CROSS/BLUE SHIELD | Admitting: Internal Medicine

## 2017-07-30 VITALS — BP 104/64 | HR 83 | Ht 65.5 in | Wt 133.0 lb

## 2017-07-30 DIAGNOSIS — Z1231 Encounter for screening mammogram for malignant neoplasm of breast: Secondary | ICD-10-CM | POA: Diagnosis not present

## 2017-07-30 DIAGNOSIS — Z1239 Encounter for other screening for malignant neoplasm of breast: Secondary | ICD-10-CM

## 2017-07-30 DIAGNOSIS — Z3009 Encounter for other general counseling and advice on contraception: Secondary | ICD-10-CM

## 2017-07-30 DIAGNOSIS — Z124 Encounter for screening for malignant neoplasm of cervix: Secondary | ICD-10-CM

## 2017-07-30 DIAGNOSIS — Z0000 Encounter for general adult medical examination without abnormal findings: Secondary | ICD-10-CM

## 2017-07-30 LAB — POCT URINALYSIS DIPSTICK
BILIRUBIN UA: NEGATIVE
Blood, UA: NEGATIVE
GLUCOSE UA: NEGATIVE
Ketones, UA: NEGATIVE
Leukocytes, UA: NEGATIVE
Nitrite, UA: NEGATIVE
Protein, UA: NEGATIVE
Spec Grav, UA: 1.015 (ref 1.010–1.025)
Urobilinogen, UA: 0.2 E.U./dL
pH, UA: 6.5 (ref 5.0–8.0)

## 2017-07-30 MED ORDER — MEDROXYPROGESTERONE ACETATE 150 MG/ML IM SUSY: 0.9000 mL | PREFILLED_SYRINGE | Freq: Once | INTRAMUSCULAR | 0 refills | Status: DC

## 2017-07-30 NOTE — Progress Notes (Signed)
Date:  07/30/2017   Name:  Candace Rangel   DOB:  Jul 30, 1975   MRN:  [id number]   Chief Complaint: Annual Exam (Breast and Papsmear.) Candace Rangel is a 42 y.o. female who presents today for her Complete Annual Exam. She feels fairly well. She reports exercising none. She reports she is sleeping well.  She denies breast problems. She has never had a mammogram. She started Depo Provera in February.  She has had no side effects.  No menses, no HA, no weight gain. She has had no recent HSV episodes - continues on daily preventative.   Review of Systems  Constitutional: Negative for chills, fatigue and fever.  HENT: Negative for congestion, hearing loss, tinnitus, trouble swallowing and voice change.   Eyes: Negative for visual disturbance.  Respiratory: Negative for cough, chest tightness, shortness of breath and wheezing.   Cardiovascular: Negative for chest pain, palpitations and leg swelling.  Gastrointestinal: Negative for abdominal pain, constipation, diarrhea and vomiting.  Endocrine: Negative for polydipsia and polyuria.  Genitourinary: Negative for dysuria, frequency, genital sores, vaginal bleeding and vaginal discharge.  Musculoskeletal: Negative for arthralgias, gait problem and joint swelling.  Skin: Negative for color change and rash.  Neurological: Negative for dizziness, tremors, light-headedness and headaches.  Hematological: Negative for adenopathy. Does not bruise/bleed easily.  Psychiatric/Behavioral: Negative for dysphoric mood and sleep disturbance. The patient is not nervous/anxious.     Patient Active Problem List   Diagnosis Date Noted  . Sprain of anterior cruciate ligament of left knee 05/22/2016  . Hyperlipidemia, mixed 05/31/2015  . Recurrent genital herpes 05/31/2015  . GERD (gastroesophageal reflux disease) 05/31/2015  . Migraine without aura and without status migrainosus, not intractable 05/31/2015    Prior to Admission  medications   Medication Sig Start Date End Date Taking? Authorizing Provider  MedroxyPROGESTERone Acetate 150 MG/ML SUSY Inject 0.9 mLs (135 mg total) into the muscle once for 1 dose. 05/27/17 05/27/17  Reubin Milan, MD  naproxen (NAPROSYN) 500 MG tablet Take 1 tablet (500 mg total) by mouth 2 (two) times daily. 03/31/16   Coralyn Mark, NP  valACYclovir (VALTREX) 1000 MG tablet Take 1 tablet (1,000 mg total) by mouth daily. 12/17/16   Reubin Milan, MD    Allergies  Allergen Reactions  . Ciprofloxacin Other (See Comments)    headache    Past Surgical History:  Procedure Laterality Date  . NO PAST SURGERIES      Social History   Tobacco Use  . Smoking status: Never Smoker  . Smokeless tobacco: Never Used  Substance Use Topics  . Alcohol use: Yes    Comment: rarely  . Drug use: No     Medication list has been reviewed and updated.  PHQ 2/9 Scores 03/04/2017 08/31/2015  PHQ - 2 Score 0 0    Physical Exam  Constitutional: She is oriented to person, place, and time. She appears well-developed and well-nourished. No distress.  HENT:  Head: Normocephalic and atraumatic.  Right Ear: Tympanic membrane and ear canal normal.  Left Ear: Tympanic membrane and ear canal normal.  Nose: Right sinus exhibits no maxillary sinus tenderness. Left sinus exhibits no maxillary sinus tenderness.  Mouth/Throat: Uvula is midline and oropharynx is clear and moist.  Eyes: Conjunctivae and EOM are normal. Right eye exhibits no discharge. Left eye exhibits no discharge. No scleral icterus.  Neck: Normal range of motion. Carotid bruit is not present. No erythema present. No thyromegaly present.  Cardiovascular: Normal rate,  regular rhythm, normal heart sounds and normal pulses.  Pulmonary/Chest: Effort normal. No respiratory distress. She has no wheezes. Right breast exhibits no mass, no nipple discharge, no skin change and no tenderness. Left breast exhibits no mass, no nipple discharge,  no skin change and no tenderness.  Abdominal: Soft. Bowel sounds are normal. There is no hepatosplenomegaly. There is no tenderness. There is no CVA tenderness.  Genitourinary: Vagina normal and uterus normal. There is no tenderness, lesion or injury on the right labia. There is no tenderness, lesion or injury on the left labia. Cervix exhibits discharge. Cervix exhibits no motion tenderness and no friability. Right adnexum displays no mass, no tenderness and no fullness. Left adnexum displays no mass, no tenderness and no fullness.  Genitourinary Comments: Scant white discharge and mild erythema of cervix No bleeding with Pap sample collection  Musculoskeletal: Normal range of motion.  Lymphadenopathy:    She has no cervical adenopathy.    She has no axillary adenopathy.  Neurological: She is alert and oriented to person, place, and time. She has normal reflexes. No cranial nerve deficit or sensory deficit.  Skin: Skin is warm, dry and intact. No rash noted.  Psychiatric: She has a normal mood and affect. Her speech is normal and behavior is normal. Thought content normal.  Nursing note and vitals reviewed.   BP 104/64   Pulse 83   Ht 5' 5.5" (1.664 m)   Wt 133 lb (60.3 kg)   SpO2 100%   BMI 21.80 kg/m   Assessment and Plan: 1. Annual physical exam - CBC with Differential/Platelet - Comprehensive metabolic panel - Lipid panel - TSH - POCT urinalysis dipstick  2. Breast cancer screening - MM DIGITAL SCREENING BILATERAL; Future  3. Pap smear for cervical cancer screening - Pap IG and HPV (high risk) DNA detection  4. Encounter for counseling regarding contraception Doing well, continue every 84-91 days - medroxyPROGESTERone Acetate 150 MG/ML SUSY; Inject 0.9 mLs (135 mg total) into the muscle once for 1 dose.  Dispense: 0.9 mL; Refill: 0   Meds ordered this encounter  Medications  . medroxyPROGESTERone Acetate 150 MG/ML SUSY    Sig: Inject 0.9 mLs (135 mg total) into the  muscle once for 1 dose.    Dispense:  0.9 mL    Refill:  0    Partially dictated using Animal nutritionistDragon software. Any errors are unintentional.  Bari EdwardLaura Franci Oshana, MD [facility]  07/30/2017

## 2017-07-30 NOTE — Patient Instructions (Signed)

## 2017-07-31 LAB — COMPREHENSIVE METABOLIC PANEL
A/G RATIO: 2 (ref 1.2–2.2)
ALK PHOS: 46 IU/L (ref 39–117)
ALT: 12 IU/L (ref 0–32)
AST: 18 IU/L (ref 0–40)
Albumin: 4.5 g/dL (ref 3.5–5.5)
BILIRUBIN TOTAL: 0.8 mg/dL (ref 0.0–1.2)
BUN/Creatinine Ratio: 18 (ref 9–23)
BUN: 14 mg/dL (ref 6–20)
CHLORIDE: 105 mmol/L (ref 96–106)
CO2: 22 mmol/L (ref 20–29)
Calcium: 9.7 mg/dL (ref 8.7–10.2)
Creatinine, Ser: 0.78 mg/dL (ref 0.57–1.00)
GFR calc Af Amer: 111 mL/min/{1.73_m2} (ref >59)
GFR calc non Af Amer: 96 mL/min/{1.73_m2} (ref >59)
GLOBULIN, TOTAL: 2.3 g/dL (ref 1.5–4.5)
Glucose: 83 mg/dL (ref 65–99)
POTASSIUM: 4.5 mmol/L (ref 3.5–5.2)
SODIUM: 141 mmol/L (ref 134–144)
Total Protein: 6.8 g/dL (ref 6.0–8.5)

## 2017-07-31 LAB — CBC WITH DIFFERENTIAL/PLATELET
BASOS: 1 %
Basophils Absolute: 0 10*3/uL (ref 0.0–0.2)
EOS (ABSOLUTE): 0.1 10*3/uL (ref 0.0–0.4)
Eos: 1 %
Hematocrit: 38.7 % (ref 34.0–46.6)
Hemoglobin: 13 g/dL (ref 11.1–15.9)
IMMATURE GRANULOCYTES: 0 %
Immature Grans (Abs): 0 10*3/uL (ref 0.0–0.1)
LYMPHS ABS: 2.4 10*3/uL (ref 0.7–3.1)
Lymphs: 36 %
MCH: 31.1 pg (ref 26.6–33.0)
MCHC: 33.6 g/dL (ref 31.5–35.7)
MCV: 93 fL (ref 79–97)
MONOS ABS: 0.6 10*3/uL (ref 0.1–0.9)
Monocytes: 9 %
NEUTROS ABS: 3.7 10*3/uL (ref 1.4–7.0)
NEUTROS PCT: 53 %
PLATELETS: 307 10*3/uL (ref 150–379)
RBC: 4.18 x10E6/uL (ref 3.77–5.28)
RDW: 13.8 % (ref 12.3–15.4)
WBC: 6.8 10*3/uL (ref 3.4–10.8)

## 2017-07-31 LAB — LIPID PANEL
Chol/HDL Ratio: 3.5 ratio (ref 0.0–4.4)
Cholesterol, Total: 218 mg/dL — ABNORMAL HIGH (ref 100–199)
HDL: 62 mg/dL (ref >39)
LDL Calculated: 145 mg/dL — ABNORMAL HIGH (ref 0–99)
TRIGLYCERIDES: 53 mg/dL (ref 0–149)
VLDL Cholesterol Cal: 11 mg/dL (ref 5–40)

## 2017-07-31 LAB — TSH: TSH: 1.76 u[IU]/mL (ref 0.450–4.500)

## 2017-08-02 ENCOUNTER — Encounter: Payer: Self-pay | Admitting: Internal Medicine

## 2017-08-02 DIAGNOSIS — B977 Papillomavirus as the cause of diseases classified elsewhere: Secondary | ICD-10-CM | POA: Insufficient documentation

## 2017-08-02 DIAGNOSIS — N72 Inflammatory disease of cervix uteri: Secondary | ICD-10-CM

## 2017-08-02 DIAGNOSIS — Z8619 Personal history of other infectious and parasitic diseases: Secondary | ICD-10-CM | POA: Insufficient documentation

## 2017-08-02 LAB — PAP IG AND HPV HIGH-RISK
HPV, HIGH-RISK: POSITIVE — AB
PAP Smear Comment: 0

## 2017-09-05 ENCOUNTER — Ambulatory Visit (INDEPENDENT_AMBULATORY_CARE_PROVIDER_SITE_OTHER): Payer: BLUE CROSS/BLUE SHIELD

## 2017-09-05 DIAGNOSIS — Z3042 Encounter for surveillance of injectable contraceptive: Secondary | ICD-10-CM | POA: Diagnosis not present

## 2017-09-05 MED ORDER — MEDROXYPROGESTERONE ACETATE 150 MG/ML IM SUSP
150.0000 mg | Freq: Once | INTRAMUSCULAR | Status: AC
  Administered 2017-09-05: 150 mg via INTRAMUSCULAR

## 2017-09-05 NOTE — Progress Notes (Signed)
Injected depo

## 2017-11-16 ENCOUNTER — Other Ambulatory Visit: Payer: Self-pay | Admitting: Internal Medicine

## 2017-11-16 DIAGNOSIS — Z3009 Encounter for other general counseling and advice on contraception: Secondary | ICD-10-CM

## 2017-11-21 ENCOUNTER — Ambulatory Visit (INDEPENDENT_AMBULATORY_CARE_PROVIDER_SITE_OTHER): Payer: BLUE CROSS/BLUE SHIELD

## 2017-11-21 DIAGNOSIS — IMO0001 Reserved for inherently not codable concepts without codable children: Secondary | ICD-10-CM

## 2017-11-21 DIAGNOSIS — Z789 Other specified health status: Secondary | ICD-10-CM | POA: Diagnosis not present

## 2017-11-21 MED ORDER — MEDROXYPROGESTERONE ACETATE 150 MG/ML IM SUSY
150.0000 mg | PREFILLED_SYRINGE | INTRAMUSCULAR | Status: AC
  Administered 2017-11-21: 150 mg via INTRAMUSCULAR

## 2018-11-04 IMAGING — MR MR KNEE*L* W/O CM
6 series · 37 of 40 positions shown · non-contrast
Comparison: None.

CLINICAL DATA: Left knee pain, painful range of motion

EXAM:
MRI OF THE LEFT KNEE WITHOUT CONTRAST
TECHNIQUE: Multiplanar, multisequence MR imaging of the knee was performed. No
intravenous contrast was administered.

[Series 3: PD fat-sat · axial · 3.0mm · 0.31mm/px · z∈[-62,+51]mm · 9 of 35 slices shown (1 of 4)]
[im 1/35]
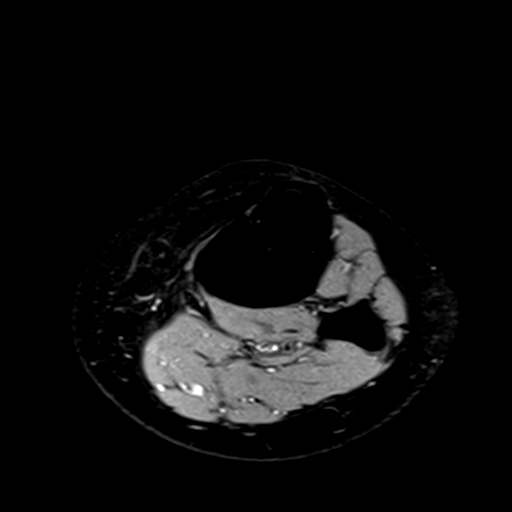
[im 5/35]
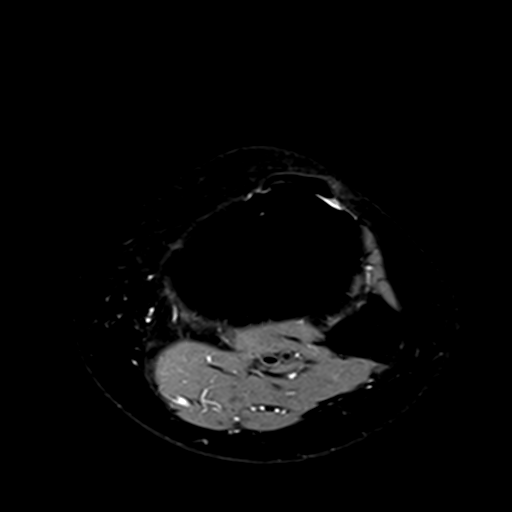
[im 9/35]
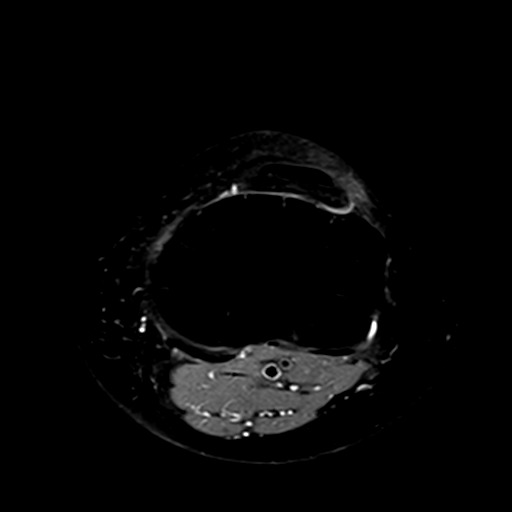
[im 13/35]
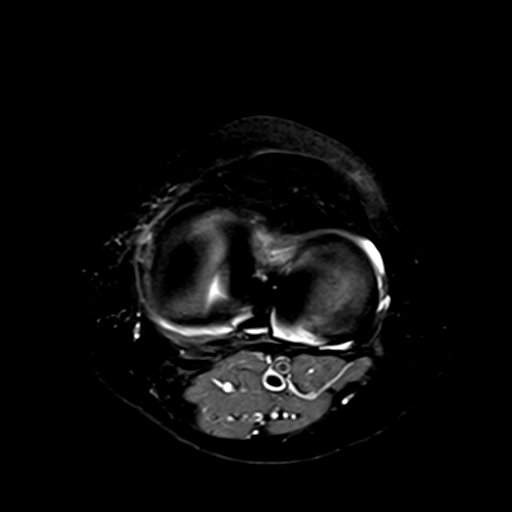
[im 18/35]
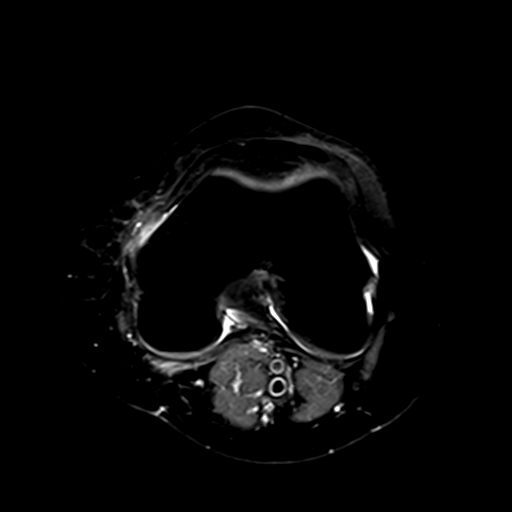
[im 22/35]
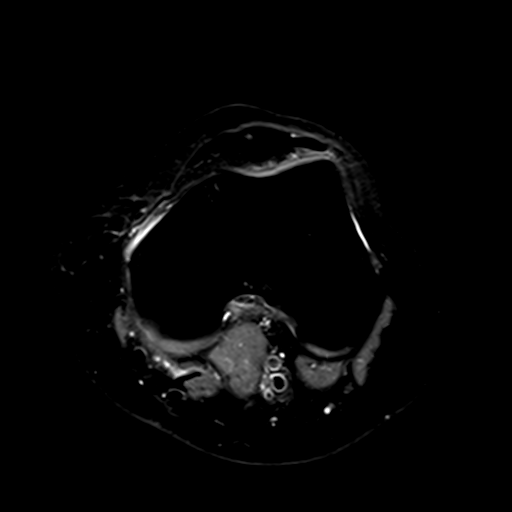
[im 26/35]
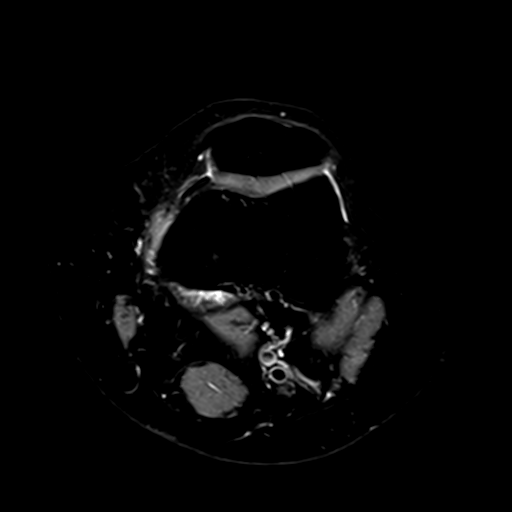
[im 30/35]
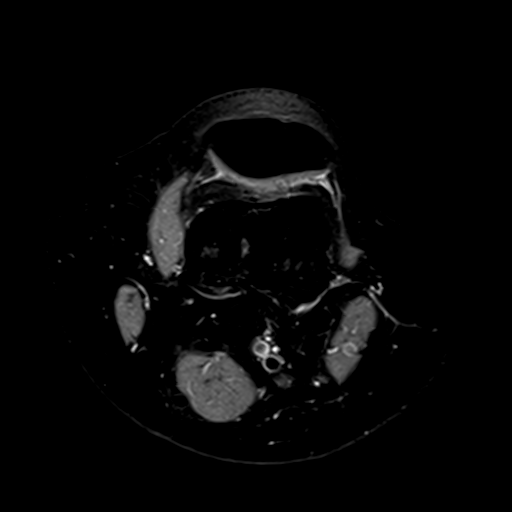
[im 35/35]
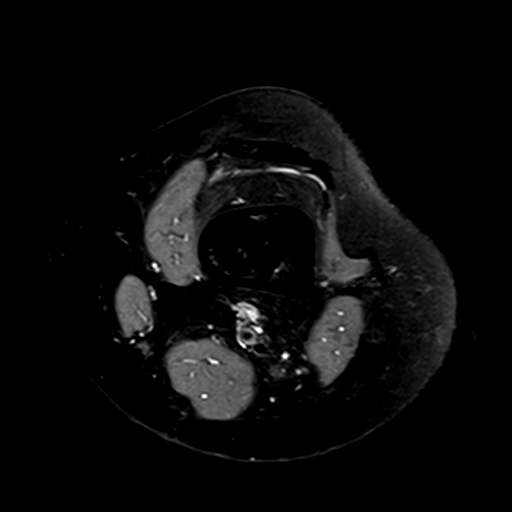

[Series 4: T1 · coronal · 3.0mm · 0.50mm/px · 4 of 31 slices shown]
[im 1/31]
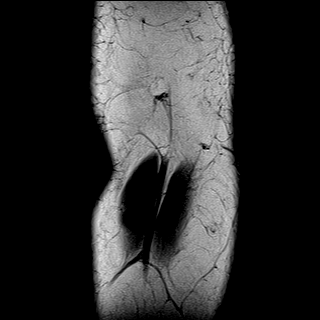
[im 6/31]
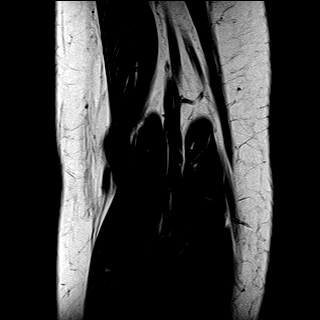
[im 11/31]
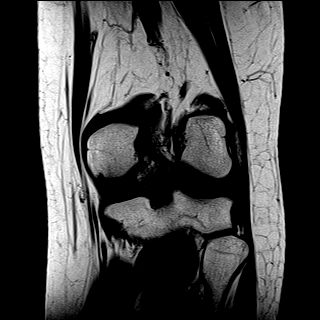
[im 16/31]
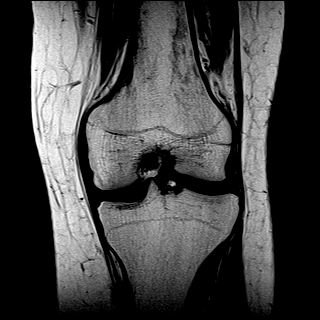

[Series 5: T2 fat-sat · coronal · 3.0mm · 0.50mm/px · 7 of 31 slices shown]
[im 1/31]
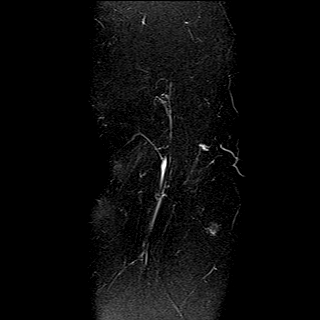
[im 6/31]
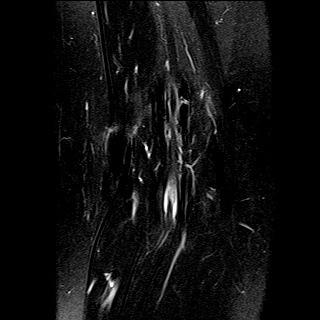
[im 11/31]
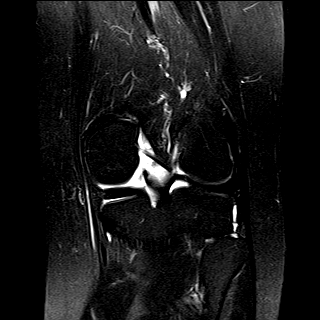
[im 16/31]
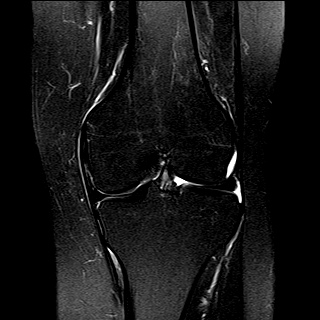
[im 21/31]
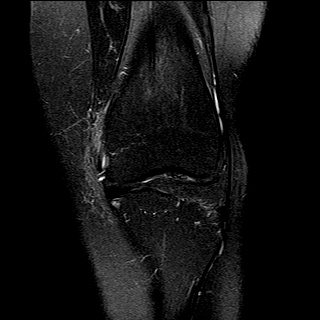
[im 26/31]
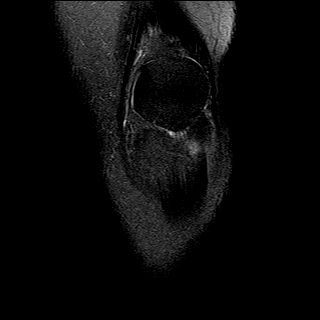
[im 31/31]
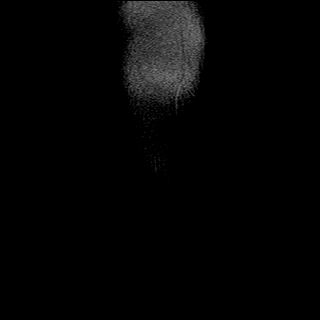

[Series 6: PD fat-sat · coronal · 3.0mm · 0.62mm/px · 7 of 31 slices shown (2 of 4)]
[im 1/31]
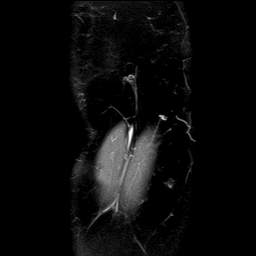
[im 6/31]
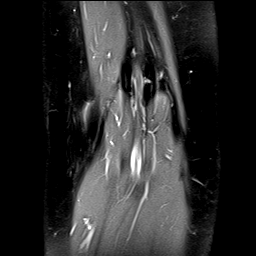
[im 11/31]
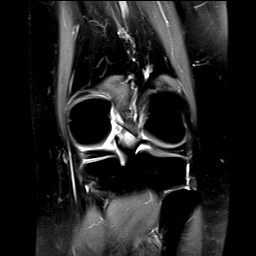
[im 16/31]
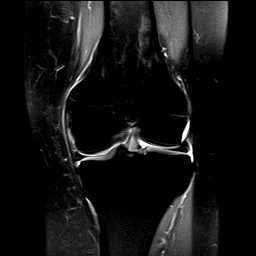
[im 21/31]
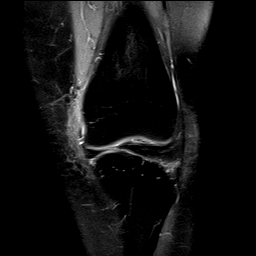
[im 26/31]
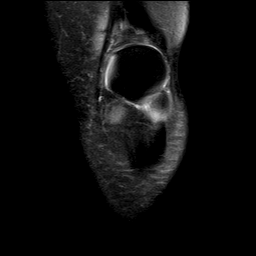
[im 31/31]
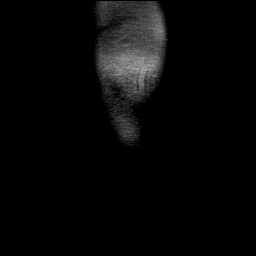

[Series 7: PD fat-sat · sagittal · 3.0mm · 0.62mm/px · 8 of 35 slices shown (3 of 4)]
[im 1/35]
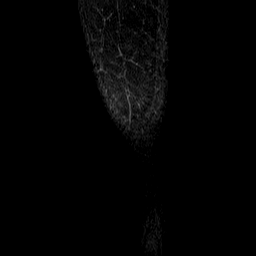
[im 5/35]
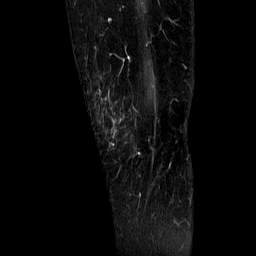
[im 10/35]
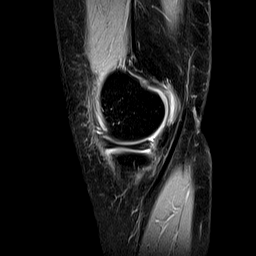
[im 15/35]
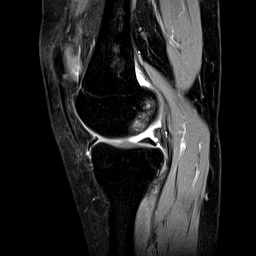
[im 20/35]
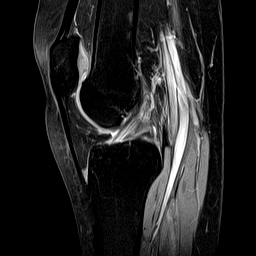
[im 25/35]
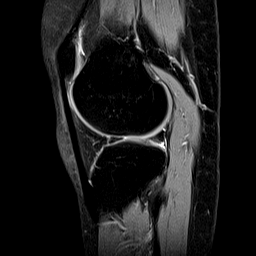
[im 30/35]
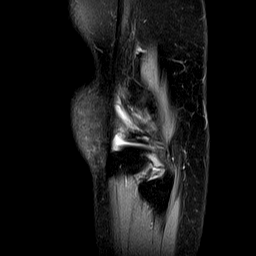
[im 35/35]
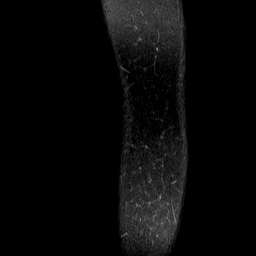

[Series 8: PD fat-sat · oblique · 2.0mm · 0.62mm/px · 2 of 9 slices shown (4 of 4)]
[im 1/9]
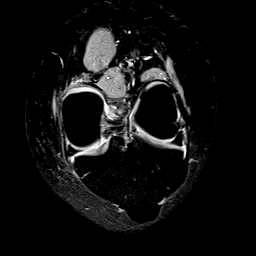
[im 9/9]
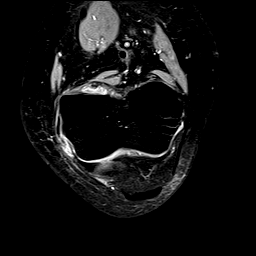

[37 of 40 positions shown; findings below may reference images not displayed]

FINDINGS: MENISCI

Medial meniscus:  Intact.

Lateral meniscus: Oblique tear of the posterior horn of the lateral
meniscus extending to the inferior articular surface.

LIGAMENTS

Cruciates: Mild increased signal of the distal ACL with the ACL
fibers intact as can be seen with mild ACL strain. Intact PCL.

Collaterals: Mild thickening of the anterior aspect of the proximal
medial collateral ligament concerning for mild MCL strain. Lateral
collateral ligament complex is intact.

CARTILAGE

Patellofemoral:  No chondral defect.

Medial:  No chondral defect.

Lateral:  No chondral defect.

Joint: No joint effusion. Mild edema in superolateral Hoffa's fat.
No plical thickening.

Popliteal Fossa: No significant Baker cyst. Intact popliteus tendon.

Extensor Mechanism: Intact quadriceps tendon and patellar tendon.
Intact medial and lateral patellar retinaculum. Intact MPFL.

Bones: No focal marrow signal abnormality. No fracture or
dislocation.

Other: No fluid collection or hematoma.
IMPRESSION: 1. Oblique tear of the posterior horn of the lateral meniscus
extending to the inferior articular surface.
2. Mild grade 2 proximal MCL injury without disruption.
3. Mild increased signal of the distal ACL with the ACL fibers
intact as can be seen with mild ACL strain without disruption.

## 2019-09-14 ENCOUNTER — Telehealth: Payer: Self-pay | Admitting: Internal Medicine

## 2019-09-14 ENCOUNTER — Encounter: Payer: Self-pay | Admitting: Internal Medicine

## 2019-09-14 ENCOUNTER — Other Ambulatory Visit (HOSPITAL_COMMUNITY)
Admission: RE | Admit: 2019-09-14 | Discharge: 2019-09-14 | Disposition: A | Discharge status: Home / Self Care | Payer: Managed Care, Other (non HMO) | Source: Ambulatory Visit | Attending: Internal Medicine | Admitting: Internal Medicine

## 2019-09-14 ENCOUNTER — Other Ambulatory Visit: Payer: Self-pay

## 2019-09-14 ENCOUNTER — Ambulatory Visit (INDEPENDENT_AMBULATORY_CARE_PROVIDER_SITE_OTHER): Payer: Managed Care, Other (non HMO) | Admitting: Internal Medicine

## 2019-09-14 VITALS — BP 118/78 | HR 101 | Temp 98.1°F | Ht 65.5 in | Wt 138.0 lb

## 2019-09-14 DIAGNOSIS — Z3042 Encounter for surveillance of injectable contraceptive: Secondary | ICD-10-CM

## 2019-09-14 DIAGNOSIS — N72 Inflammatory disease of cervix uteri: Secondary | ICD-10-CM

## 2019-09-14 DIAGNOSIS — B977 Papillomavirus as the cause of diseases classified elsewhere: Secondary | ICD-10-CM | POA: Insufficient documentation

## 2019-09-14 DIAGNOSIS — Z3009 Encounter for other general counseling and advice on contraception: Secondary | ICD-10-CM

## 2019-09-14 DIAGNOSIS — Z1231 Encounter for screening mammogram for malignant neoplasm of breast: Secondary | ICD-10-CM | POA: Diagnosis not present

## 2019-09-14 DIAGNOSIS — Z0000 Encounter for general adult medical examination without abnormal findings: Secondary | ICD-10-CM

## 2019-09-14 DIAGNOSIS — A6000 Herpesviral infection of urogenital system, unspecified: Secondary | ICD-10-CM

## 2019-09-14 LAB — POCT URINALYSIS DIPSTICK
Bilirubin, UA: NEGATIVE
Glucose, UA: NEGATIVE
Ketones, UA: NEGATIVE
Leukocytes, UA: NEGATIVE
Nitrite, UA: NEGATIVE
Protein, UA: NEGATIVE
Spec Grav, UA: 1.01 (ref 1.010–1.025)
Urobilinogen, UA: 0.2 E.U./dL
pH, UA: 5 (ref 5.0–8.0)

## 2019-09-14 LAB — POCT URINE PREGNANCY: Preg Test, Ur: NEGATIVE

## 2019-09-14 MED ORDER — VALACYCLOVIR HCL 1 G PO TABS: 1000.0000 mg | ORAL_TABLET | Freq: Every day | ORAL | 12 refills | Status: DC

## 2019-09-14 MED ORDER — MEDROXYPROGESTERONE ACETATE 150 MG/ML IM SUSP: 150.0000 mg | INTRAMUSCULAR | 3 refills | Status: DC

## 2019-09-14 MED ORDER — MEDROXYPROGESTERONE ACETATE 150 MG/ML IM SUSP
150.0000 mg | Freq: Once | INTRAMUSCULAR | Status: AC
  Administered 2019-09-14: 150 mg via INTRAMUSCULAR

## 2019-09-14 NOTE — Telephone Encounter (Addendum)
Patient requesting medroxyPROGESTERone (DEPO-PROVERA) 150 MG/ML injection please send to [facility] [address], [place], [place] [address] [phone] Patient would like a follow up call when completed

## 2019-09-14 NOTE — Telephone Encounter (Signed)
[facility] to [facility] and called and spoke with pt to inform her.   CM

## 2019-09-14 NOTE — Progress Notes (Signed)
Date:  09/14/2019   Name:  Candace Rangel   DOB:  1975/08/22   MRN:  [id number]   Chief Complaint: Annual Exam (Breast Exam and Pap smear recheck from [facility] in 2019. Wants to be prescribed DEPO. ) Candace Rangel is a 44 y.o. female who presents today for her Complete Annual Exam. She feels well. She reports exercising walking at work. She reports she is sleeping fairly well.  She moved to [place] last year to be near her kids.  However did not decided to stay so moved back.  She quit Depo Provera during that time as she did not seek medical care in [place].  She also ran out of Valtrex.  This is also the reason she did not return for Pap in 2020. She would like to resume contraception.  Options discussed and she has decided on Depo Provera again.  Pap 2019 - neg with HPV+ Pt did not return for repeat last year Mammogram - none  There is no immunization history on file for this patient.  HPI  Lab Results  Component Value Date   CREATININE 0.78 07/30/2017   BUN 14 07/30/2017   NA 141 07/30/2017   K 4.5 07/30/2017   CL 105 07/30/2017   CO2 22 07/30/2017   Lab Results  Component Value Date   CHOL 218 (H) 07/30/2017   HDL 62 07/30/2017   LDLCALC 145 (H) 07/30/2017   TRIG 53 07/30/2017   CHOLHDL 3.5 07/30/2017   Lab Results  Component Value Date   TSH 1.760 07/30/2017   No results found for: HGBA1C Lab Results  Component Value Date   WBC 6.8 07/30/2017   HGB 13.0 07/30/2017   HCT 38.7 07/30/2017   MCV 93 07/30/2017   PLT 307 07/30/2017   Lab Results  Component Value Date   ALT 12 07/30/2017   AST 18 07/30/2017   ALKPHOS 46 07/30/2017   BILITOT 0.8 07/30/2017     Review of Systems  Constitutional: Negative for chills, fatigue and fever.  HENT: Positive for sinus pressure. Negative for congestion, hearing loss, tinnitus, trouble swallowing and voice change.   Eyes: Negative for visual disturbance.  Respiratory: Negative for cough, chest  tightness, shortness of breath and wheezing.   Cardiovascular: Negative for chest pain, palpitations and leg swelling.  Gastrointestinal: Negative for abdominal pain, constipation, diarrhea and vomiting.       Gerd intermittently   Endocrine: Negative for polydipsia and polyuria.  Genitourinary: Negative for dysuria, frequency, genital sores, menstrual problem, vaginal bleeding and vaginal discharge.  Musculoskeletal: Negative for arthralgias, gait problem and joint swelling.  Skin: Negative for color change and rash.  Allergic/Immunologic: Positive for environmental allergies.  Neurological: Negative for dizziness, tremors, light-headedness and headaches.  Hematological: Negative for adenopathy. Does not bruise/bleed easily.  Psychiatric/Behavioral: Negative for dysphoric mood and sleep disturbance. The patient is not nervous/anxious.     Patient Active Problem List   Diagnosis Date Noted  . High risk human papilloma virus (HPV) infection of cervix 08/02/2017  . Sprain of anterior cruciate ligament of left knee 05/22/2016  . Hyperlipidemia, mixed 05/31/2015  . Recurrent genital herpes 05/31/2015  . GERD (gastroesophageal reflux disease) 05/31/2015  . Migraine without aura and without status migrainosus, not intractable 05/31/2015    Allergies  Allergen Reactions  . Ciprofloxacin Other (See Comments)    headache    Past Surgical History:  Procedure Laterality Date  . NO PAST SURGERIES      Social  History   Tobacco Use  . Smoking status: Never Smoker  . Smokeless tobacco: Never Used  Substance Use Topics  . Alcohol use: Yes    Comment: rarely  . Drug use: No     Medication list has been reviewed and updated.  Current Meds  Medication Sig  . naproxen (NAPROSYN) 500 MG tablet Take 1 tablet (500 mg total) by mouth 2 (two) times daily.  . valACYclovir (VALTREX) 1000 MG tablet Take 1 tablet (1,000 mg total) by mouth daily.    PHQ 2/9 Scores 09/14/2019 03/04/2017  08/31/2015  PHQ - 2 Score 0 0 0  PHQ- 9 Score 0 - -    BP Readings from Last 3 Encounters:  09/14/19 118/78  07/30/17 104/64  05/27/17 118/68    Physical Exam Vitals and nursing note reviewed.  Constitutional:      General: She is not in acute distress.    Appearance: She is well-developed.  HENT:     Head: Normocephalic and atraumatic.     Right Ear: Tympanic membrane and ear canal normal.     Left Ear: Tympanic membrane and ear canal normal.     Nose:     Right Sinus: No maxillary sinus tenderness.     Left Sinus: No maxillary sinus tenderness.  Eyes:     General: No scleral icterus.       Right eye: No discharge.        Left eye: No discharge.     Conjunctiva/sclera: Conjunctivae normal.  Neck:     Thyroid: No thyromegaly.     Vascular: No carotid bruit.  Cardiovascular:     Rate and Rhythm: Normal rate and regular rhythm.     Pulses: Normal pulses.     Heart sounds: Normal heart sounds.  Pulmonary:     Effort: Pulmonary effort is normal. No respiratory distress.     Breath sounds: No wheezing.  Chest:     Breasts:        Right: No mass, nipple discharge, skin change or tenderness.        Left: No mass, nipple discharge, skin change or tenderness.  Abdominal:     General: Bowel sounds are normal.     Palpations: Abdomen is soft.     Tenderness: There is no abdominal tenderness.  Genitourinary:    Labia:        Right: No tenderness, lesion or injury.        Left: No tenderness, lesion or injury.      Vagina: Normal.     Cervix: Discharge (thick white mucus) present. No friability, lesion, erythema or cervical bleeding.     Uterus: Normal.      Adnexa: Right adnexa normal and left adnexa normal.     Comments: Pap with HPV obtained Musculoskeletal:        General: Normal range of motion.     Cervical back: Normal range of motion. No erythema.     Right lower leg: No edema.     Left lower leg: No edema.  Lymphadenopathy:     Cervical: No cervical adenopathy.   Skin:    General: Skin is warm and dry.     Capillary Refill: Capillary refill takes less than 2 seconds.     Findings: No rash.  Neurological:     General: No focal deficit present.     Mental Status: She is alert and oriented to person, place, and time.     Cranial Nerves: No cranial  nerve deficit.     Sensory: No sensory deficit.     Deep Tendon Reflexes: Reflexes are normal and symmetric.  Psychiatric:        Speech: Speech normal.        Behavior: Behavior normal.        Thought Content: Thought content normal.     Wt Readings from Last 3 Encounters:  09/14/19 138 lb (62.6 kg)  07/30/17 133 lb (60.3 kg)  05/27/17 137 lb (62.1 kg)    BP 118/78   Pulse (!) 101   Temp 98.1 F (36.7 C) (Oral)   Ht 5' 5.5" (1.664 m)   Wt 138 lb (62.6 kg)   LMP 08/31/2019 (Exact Date)   SpO2 99%   BMI 22.62 kg/m   Assessment and Plan: 1. Annual physical exam Normal exam Continue healthy diet, exercise - CBC with Differential/Platelet - Comprehensive metabolic panel - Lipid panel - TSH - POCT urinalysis dipstick  2. High risk human papilloma virus (HPV) infection of cervix Pap obtained.  Pt declined STI testing - Cytology - PAP  3. Encounter for screening mammogram for breast cancer Encourage her to schedule screening mammogram - MM 3D SCREEN BREAST BILATERAL; Future  4. Encounter for Depo-Provera contraception - medroxyPROGESTERone (DEPO-PROVERA) 150 MG/ML injection; Inject 1 mL (150 mg total) into the muscle every 3 (three) months.  Dispense: 1 mL; Refill: 3  5. Encounter for counseling regarding contraception Urine pregnancy negative Pt returned from the pharmacy and depo administered - medroxyPROGESTERone (DEPO-PROVERA) 150 MG/ML injection; Inject 1 mL (150 mg total) into the muscle every 3 (three) months.  Dispense: 1 mL; Refill: 3  6. Recurrent genital herpes - valACYclovir (VALTREX) 1000 MG tablet; Take 1 tablet (1,000 mg total) by mouth daily.  Dispense: 30 tablet;  Refill: 12   Partially dictated using Animal nutritionist. Any errors are unintentional.  Bari Edward, MD [facility]  09/14/2019

## 2019-09-15 LAB — COMPREHENSIVE METABOLIC PANEL
ALT: 9 IU/L (ref 0–32)
AST: 15 IU/L (ref 0–40)
Albumin/Globulin Ratio: 1.7 (ref 1.2–2.2)
Albumin: 4.4 g/dL (ref 3.8–4.8)
Alkaline Phosphatase: 48 IU/L (ref 48–121)
BUN/Creatinine Ratio: 19 (ref 9–23)
BUN: 14 mg/dL (ref 6–24)
Bilirubin Total: 0.7 mg/dL (ref 0.0–1.2)
CO2: 21 mmol/L (ref 20–29)
Calcium: 9.4 mg/dL (ref 8.7–10.2)
Chloride: 99 mmol/L (ref 96–106)
Creatinine, Ser: 0.75 mg/dL (ref 0.57–1.00)
GFR calc Af Amer: 114 mL/min/{1.73_m2} (ref >59)
GFR calc non Af Amer: 99 mL/min/{1.73_m2} (ref >59)
Globulin, Total: 2.6 g/dL (ref 1.5–4.5)
Glucose: 80 mg/dL (ref 65–99)
Potassium: 4.2 mmol/L (ref 3.5–5.2)
Sodium: 138 mmol/L (ref 134–144)
Total Protein: 7 g/dL (ref 6.0–8.5)

## 2019-09-15 LAB — CBC WITH DIFFERENTIAL/PLATELET
Basophils Absolute: 0.1 10*3/uL (ref 0.0–0.2)
Basos: 1 %
EOS (ABSOLUTE): 0.1 10*3/uL (ref 0.0–0.4)
Eos: 1 %
Hematocrit: 40.4 % (ref 34.0–46.6)
Hemoglobin: 13.7 g/dL (ref 11.1–15.9)
Immature Grans (Abs): 0 10*3/uL (ref 0.0–0.1)
Immature Granulocytes: 0 %
Lymphocytes Absolute: 2.6 10*3/uL (ref 0.7–3.1)
Lymphs: 35 %
MCH: 30.3 pg (ref 26.6–33.0)
MCHC: 33.9 g/dL (ref 31.5–35.7)
MCV: 89 fL (ref 79–97)
Monocytes Absolute: 0.7 10*3/uL (ref 0.1–0.9)
Monocytes: 9 %
Neutrophils Absolute: 4 10*3/uL (ref 1.4–7.0)
Neutrophils: 54 %
Platelets: 302 10*3/uL (ref 150–450)
RBC: 4.52 x10E6/uL (ref 3.77–5.28)
RDW: 12.9 % (ref 11.7–15.4)
WBC: 7.4 10*3/uL (ref 3.4–10.8)

## 2019-09-15 LAB — LIPID PANEL
Chol/HDL Ratio: 3.2 ratio (ref 0.0–4.4)
Cholesterol, Total: 243 mg/dL — ABNORMAL HIGH (ref 100–199)
HDL: 75 mg/dL (ref >39)
LDL Chol Calc (NIH): 156 mg/dL — ABNORMAL HIGH (ref 0–99)
Triglycerides: 71 mg/dL (ref 0–149)
VLDL Cholesterol Cal: 12 mg/dL (ref 5–40)

## 2019-09-15 LAB — TSH: TSH: 2.06 u[IU]/mL (ref 0.450–4.500)

## 2019-09-16 LAB — CYTOLOGY - PAP
Comment: NEGATIVE
Diagnosis: NEGATIVE
High risk HPV: NEGATIVE

## 2019-10-14 ENCOUNTER — Telehealth: Payer: Self-pay | Admitting: Internal Medicine

## 2019-10-14 ENCOUNTER — Encounter: Payer: Self-pay | Admitting: Family Medicine

## 2019-10-14 ENCOUNTER — Other Ambulatory Visit: Payer: Self-pay

## 2019-10-14 ENCOUNTER — Ambulatory Visit (INDEPENDENT_AMBULATORY_CARE_PROVIDER_SITE_OTHER): Payer: Managed Care, Other (non HMO) | Admitting: Family Medicine

## 2019-10-14 VITALS — BP 134/78 | HR 97 | Ht 65.5 in | Wt 134.0 lb

## 2019-10-14 DIAGNOSIS — N939 Abnormal uterine and vaginal bleeding, unspecified: Secondary | ICD-10-CM

## 2019-10-14 DIAGNOSIS — R102 Pelvic and perineal pain: Secondary | ICD-10-CM

## 2019-10-14 DIAGNOSIS — N921 Excessive and frequent menstruation with irregular cycle: Secondary | ICD-10-CM

## 2019-10-14 LAB — POCT WET PREP WITH KOH
KOH Prep POC: NEGATIVE
Trichomonas, UA: NEGATIVE

## 2019-10-14 LAB — POCT URINALYSIS DIPSTICK
Bilirubin, UA: NEGATIVE
Glucose, UA: NEGATIVE
Ketones, UA: NEGATIVE
Leukocytes, UA: NEGATIVE
Nitrite, UA: NEGATIVE
Protein, UA: NEGATIVE
Spec Grav, UA: 1.01 (ref 1.010–1.025)
Urobilinogen, UA: 0.2 E.U./dL
pH, UA: 5 (ref 5.0–8.0)

## 2019-10-14 NOTE — Patient Instructions (Addendum)
Hormonal Contraception Information Hormonal contraception is a type of birth control that uses hormones to prevent pregnancy. It usually involves a combination of the hormones estrogen and progesterone or only the hormone progesterone. Hormonal contraception works in these ways:  It thickens the mucus in the cervix, making it harder for sperm to enter the uterus.  It changes the lining of the uterus, making it harder for an egg to implant.  It may stop the ovaries from releasing eggs (ovulation). Some women who take hormonal contraceptives that contain only progesterone may continue to ovulate. Hormonal contraception cannot prevent sexually transmitted infections (STIs). Pregnancy may still occur. Estrogen and progesterone contraceptives Contraceptives that use a combination of estrogen and progesterone are available in these forms:  Pill. Pills come in different combinations of hormones. They must be taken at the same time each day. Pills can affect your period, causing you to get your period once every three months or not at all.  Patch. The patch must be worn on the lower abdomen for three weeks and then removed on the fourth.  Vaginal ring. The ring is placed in the vagina and left there for three weeks. It is then removed for one week. Progesterone contraceptives Contraceptives that use progesterone only are available in these forms:  Pill. Pills should be taken every day of the cycle.  Intrauterine device (IUD). This device is inserted into the uterus and removed or replaced every five years or sooner.  Implant. Plastic rods are placed under the skin of the upper arm. They are removed or replaced every three years or sooner.  Injection. The injection is given once every 90 days. What are the side effects? The side effects of estrogen and progesterone contraceptives include:  Nausea.  Headaches.  Breast tenderness.  Bleeding or spotting between menstrual cycles.  High blood  pressure (rare).  Strokes, heart attacks, or blood clots (rare) Side effects of progesterone-only contraceptives include:  Nausea.  Headaches.  Breast tenderness.  Unpredictable menstrual bleeding.  High blood pressure (rare). Talk to your health care provider about what side effects may affect you. Where to find more information  Ask your health care provider for more information and resources about hormonal contraception.  [place] Department of Health and [employer] on Women's Health: http://hoffman.com/ Questions to ask:  What type of hormonal contraception is right for me?  How long should I plan to use hormonal contraception?  What are the side effects of the hormonal contraception method I choose?  How can I prevent STIs while using hormonal contraception? Contact a health care provider if:  You start taking hormonal contraceptives and you develop persistent or severe side effects. Summary  Estrogen and progesterone are hormones used in many forms of birth control.  Talk to your health care provider about what side effects may affect you.  Hormonal contraception cannot prevent sexually transmitted infections (STIs).  Ask your health care provider for more information and resources about hormonal contraception. This information is not intended to replace advice given to you by your health care provider. Make sure you discuss any questions you have with your health care provider. Document Revised: 08/03/2018 Document Reviewed: 03/08/2016 Elsevier Patient Education  2020 [employer]. Etonogestrel implant What is this medicine? ETONOGESTREL (et oh noe JES trel) is a contraceptive (birth control) device. It is used to prevent pregnancy. It can be used for up to 3 years. This medicine may be used for other purposes; ask your health care provider or pharmacist if  you have questions. COMMON BRAND NAME(S): Implanon, Nexplanon What should I tell my health care  provider before I take this medicine? They need to know if you have any of these conditions:  abnormal vaginal bleeding  blood vessel disease or blood clots  breast, cervical, endometrial, ovarian, liver, or uterine cancer  diabetes  gallbladder disease  heart disease or recent heart attack  high blood pressure  high cholesterol or triglycerides  kidney disease  liver disease  migraine headaches  seizures  stroke  tobacco smoker  an unusual or allergic reaction to etonogestrel, anesthetics or antiseptics, other medicines, foods, dyes, or preservatives  pregnant or trying to get pregnant  breast-feeding How should I use this medicine? This device is inserted just under the skin on the inner side of your upper arm by a health care professional. Talk to your pediatrician regarding the use of this medicine in children. Special care may be needed. Overdosage: If you think you have taken too much of this medicine contact a poison control center or emergency room at once. NOTE: This medicine is only for you. Do not share this medicine with others. What if I miss a dose? This does not apply. What may interact with this medicine? Do not take this medicine with any of the following medications:  amprenavir  fosamprenavir This medicine may also interact with the following medications:  acitretin  aprepitant  armodafinil  bexarotene  bosentan  carbamazepine  certain medicines for fungal infections like fluconazole, ketoconazole, itraconazole and voriconazole  certain medicines to treat hepatitis, HIV or AIDS  cyclosporine  felbamate  griseofulvin  lamotrigine  modafinil  oxcarbazepine  phenobarbital  phenytoin  primidone  rifabutin  rifampin  rifapentine  St. John's wort  topiramate This list may not describe all possible interactions. Give your health care provider a list of all the medicines, herbs, non-prescription drugs, or dietary  supplements you use. Also tell them if you smoke, drink alcohol, or use illegal drugs. Some items may interact with your medicine. What should I watch for while using this medicine? This product does not protect you against HIV infection (AIDS) or other sexually transmitted diseases. You should be able to feel the implant by pressing your fingertips over the skin where it was inserted. Contact your doctor if you cannot feel the implant, and use a non-hormonal birth control method (such as condoms) until your doctor confirms that the implant is in place. Contact your doctor if you think that the implant may have broken or become bent while in your arm. You will receive a user card from your health care provider after the implant is inserted. The card is a record of the location of the implant in your upper arm and when it should be removed. Keep this card with your health records. What side effects may I notice from receiving this medicine? Side effects that you should report to your doctor or health care professional as soon as possible:  allergic reactions like skin rash, itching or hives, swelling of the face, lips, or tongue  breast lumps, breast tissue changes, or discharge  breathing problems  changes in emotions or moods  coughing up blood  if you feel that the implant may have broken or bent while in your arm  high blood pressure  pain, irritation, swelling, or bruising at the insertion site  scar at site of insertion  signs of infection at the insertion site such as fever, and skin redness, pain or discharge  signs  and symptoms of a blood clot such as breathing problems; changes in vision; chest pain; severe, sudden headache; pain, swelling, warmth in the leg; trouble speaking; sudden numbness or weakness of the face, arm or leg  signs and symptoms of liver injury like dark yellow or brown urine; general ill feeling or flu-like symptoms; light-colored stools; loss of appetite;  nausea; right upper belly pain; unusually weak or tired; yellowing of the eyes or skin  unusual vaginal bleeding, discharge Side effects that usually do not require medical attention (report to your doctor or health care professional if they continue or are bothersome):  acne  breast pain or tenderness  headache  irregular menstrual bleeding  nausea This list may not describe all possible side effects. Call your doctor for medical advice about side effects. You may report side effects to FDA at 1-800-FDA-1088. Where should I keep my medicine? This drug is given in a hospital or clinic and will not be stored at home. NOTE: This sheet is a summary. It may not cover all possible information. If you have questions about this medicine, talk to your doctor, pharmacist, or health care provider.  2020 Elsevier/Gold Standard (2019-01-19 11:33:04) Ethinyl Estradiol; Etonogestrel vaginal ring What is this medicine? ETHINYL ESTRADIOL; ETONOGESTREL (ETH in il es tra DYE ole; et oh noe JES trel) vaginal ring is a flexible, vaginal ring used as a contraceptive (birth control method). This medicine combines 2 types of female hormones, an estrogen and a progestin. This ring is used to prevent ovulation and pregnancy. Each ring is effective for 1 month. This medicine may be used for other purposes; ask your health care provider or pharmacist if you have questions. COMMON BRAND NAME(S): EluRyng, NuvaRing What should I tell my health care provider before I take this medicine? They need to know if you have any of these conditions:  abnormal vaginal bleeding  blood vessel disease or blood clots  breast, cervical, endometrial, ovarian, liver, or uterine cancer  diabetes  gallbladder disease  having surgery  heart disease or recent heart attack  high blood pressure  high cholesterol or triglycerides  history of irregular heartbeat or heart valve problems  kidney disease  liver  disease  migraine headaches  protein C deficiency  protein S deficiency  recently had a baby, miscarriage, or abortion  stroke  systemic lupus erythematosus (SLE)  tobacco smoker  your age is more than 42 years old  an unusual or allergic reaction to estrogens, progestins, other medicines, foods, dyes, or preservatives  pregnant or trying to get pregnant  breast-feeding How should I use this medicine? Insert the ring into your vagina as directed. Follow the directions on the prescription label. The ring will remain place for 3 weeks and is then removed for a 1-week break. A new ring is inserted 1 week after the last ring was removed, on the same day of the week. Check often to make sure the ring is still in place. If the ring was out of the vagina for an unknown amount of time, you may not be protected from pregnancy. Perform a pregnancy test and call your doctor. Do not use more often than directed. A patient package insert for the product will be given with each prescription and refill. Read this sheet carefully each time. The sheet may change frequently. Contact your pediatrician regarding the use of this medicine in children. Special care may be needed. Overdosage: If you think you have taken too much of this medicine contact a  poison control center or emergency room at once. NOTE: This medicine is only for you. Do not share this medicine with others. What if I miss a dose? You will need to use the ring exactly as directed. It is very important to follow the schedule every cycle. If you do not use the ring as directed, you may not be protected from pregnancy. If the ring should slip out, is lost, or if you leave it in longer or shorter than you should, contact your health care professional for advice. What may interact with this medicine? Do not take this medicine with the following medications:  dasabuvir; ombitasvir; paritaprevir; ritonavir  ombitasvir; paritaprevir;  ritonavir  vaginal lubricants or other vaginal products that are oil-based or silicone-based This medicine may also interact with the following medications:  acetaminophen  antibiotics or medicines for infections, especially rifampin, rifabutin, rifapentine, and griseofulvin, and possibly penicillins or tetracyclines  aprepitant or fosaprepitant  armodafinil  ascorbic acid (vitamin C)  barbiturate medicines, such as phenobarbital or primidone  bosentan  certain antiviral medicines for hepatitis, HIV or AIDS  certain medicines for cancer treatment  certain medicines for seizures like carbamazepine, clobazam, felbamate, lamotrigine, oxcarbazepine, phenytoin, rufinamide, topiramate  certain medicines for treating high cholesterol  cyclosporine  dantrolene  elagolix  flibanserin  grapefruit juice  lesinurad  medicines for diabetes  medicines to treat fungal infections, such as griseofulvin, miconazole, fluconazole, ketoconazole, itraconazole, posaconazole or voriconazole  mifepristone  mitotane  modafinil  morphine  mycophenolate  St. John's wort  tamoxifen  temazepam  theophylline or aminophylline  thyroid hormones  tizanidine  tranexamic acid  ulipristal  warfarin This list may not describe all possible interactions. Give your health care provider a list of all the medicines, herbs, non-prescription drugs, or dietary supplements you use. Also tell them if you smoke, drink alcohol, or use illegal drugs. Some items may interact with your medicine. What should I watch for while using this medicine? Visit your doctor or health care professional for regular checks on your progress. You will need a regular breast and pelvic exam and Pap smear while on this medicine. Check with your doctor or health care professional to see if you need an additional method of contraception during the first cycle that you use this ring. Female condoms (made with natural  rubber latex, polyisoprene, and polyurethane) and spermicides may be used. Do not use a diaphragm, cervical cap, or a female condom, as the ring can interfere with these birth control methods and their proper placement. If you have any reason to think you are pregnant, stop using this medicine right away and contact your doctor or health care professional. If you are using this medicine for hormone related problems, it may take several cycles of use to see improvement in your condition. Smoking increases the risk of getting a blood clot or having a stroke while you are using hormonal birth control, especially if you are more than 42 years old. You are strongly advised not to smoke. Some women are prone to getting dark patches on the skin of the face (cholasma). Your risk of getting chloasma with this medicine is higher if you had chloasma during a pregnancy. Keep out of the sun. If you cannot avoid being in the sun, wear protective clothing and use sunscreen. Do not use sun lamps or tanning beds/booths. This medicine can make your body retain fluid, making your fingers, hands, or ankles swell. Your blood pressure can go up. Contact your doctor or health  care professional if you feel you are retaining fluid. If you are going to have elective surgery, you may need to stop using this medicine before the surgery. Consult your health care professional for advice. This medicine does not protect you against HIV infection (AIDS) or any other sexually transmitted diseases. What side effects may I notice from receiving this medicine? Side effects that you should report to your doctor or health care professional as soon as possible:  allergic reactions such as skin rash or itching, hives, swelling of the lips, mouth, tongue, or throat  depression  high blood pressure  migraines or severe, sudden headaches  signs and symptoms of a blood clot such as breathing problems; changes in vision; chest pain; severe,  sudden headache; pain, swelling, warmth in the leg; trouble speaking; sudden numbness or weakness of the face, arm or leg  signs and symptoms of infection like fever or chills with dizziness and a sunburn-like rash, or pain or trouble passing urine  stomach pain  symptoms of vaginal infection like itching, irritation or unusual discharge  yellowing of the eyes or skin Side effects that usually do not require medical attention (report these to your doctor or health care professional if they continue or are bothersome):  acne  breast pain, tenderness  irregular vaginal bleeding or spotting, particularly during the first month of use  mild headache  nausea  painful periods  vomiting This list may not describe all possible side effects. Call your doctor for medical advice about side effects. You may report side effects to FDA at 1-800-FDA-1088. Where should I keep my medicine? Keep out of the reach of children. Store unopened medicine for up to 4 months at room temperature at 15 and 30 degrees C (59 and 86 degrees F). Protect from light. Do not store above 30 degrees C (86 degrees F). Throw away any unused medicine 4 months after the dispense date or the expiration date, whichever comes first. A ring may only be used for 1 cycle (1 month). After the 3-week cycle, a used ring is removed and should be placed in the re-closable foil pouch and discarded in the trash out of reach of children and pets. Do NOT flush down the toilet. NOTE: This sheet is a summary. It may not cover all possible information. If you have questions about this medicine, talk to your doctor, pharmacist, or health care provider.  2020 Elsevier/Gold Standard (2018-10-29 12:31:47)

## 2019-10-14 NOTE — Progress Notes (Signed)
Date:  10/14/2019   Name:  Candace Rangel   DOB:  1975/08/28   MRN:  [id number]   Chief Complaint: Urinary Tract Infection (x4-5 advil, and AZO, burning, foul smell, bad cramps, no discharge  )  Urinary Tract Infection  This is a new problem. The current episode started in the past 7 days (Sunday). The problem has been waxing and waning. The quality of the pain is described as aching. The pain is moderate. There has been no fever. She is sexually active. There is no history of pyelonephritis. Associated symptoms include flank pain. Pertinent negatives include no chills, discharge, frequency, hematuria, hesitancy, nausea, possible pregnancy, sweats, urgency or vomiting. Associated symptoms comments: Bilateral flank/depo 4 weeks ago/mild vag bleeding. She has tried increased fluids for the symptoms. The treatment provided mild relief. There is no history of kidney stones or recurrent UTIs.  Abdominal Pain This is a new (pelvic/bilateral) problem. The current episode started in the past 7 days (Sunday). The onset quality is sudden. The problem occurs constantly. The problem has been gradually improving. The pain is located in the RLQ and LLQ. The pain is at a severity of 2/10. The pain is mild. The quality of the pain is cramping. The abdominal pain does not radiate. Associated symptoms include diarrhea. Pertinent negatives include no arthralgias, constipation, dysuria, fever, frequency, headaches, hematochezia, hematuria, myalgias, nausea or vomiting.  Vaginal Bleeding The patient's primary symptoms include pelvic pain and vaginal bleeding. The patient's pertinent negatives include no genital itching, genital lesions, genital odor, genital rash, missed menses or vaginal discharge. Primary symptoms comment: spotting. This is a new problem. The current episode started in the past 7 days (Saturday). The problem has been gradually improving. The pain is mild. Associated symptoms include  abdominal pain, diarrhea and flank pain. Pertinent negatives include no back pain, chills, constipation, dysuria, fever, frequency, headaches, hematuria, nausea, rash, sore throat, urgency or vomiting.    Lab Results  Component Value Date   CREATININE 0.75 09/14/2019   BUN 14 09/14/2019   NA 138 09/14/2019   K 4.2 09/14/2019   CL 99 09/14/2019   CO2 21 09/14/2019   Lab Results  Component Value Date   CHOL 243 (H) 09/14/2019   HDL 75 09/14/2019   LDLCALC 156 (H) 09/14/2019   TRIG 71 09/14/2019   CHOLHDL 3.2 09/14/2019   Lab Results  Component Value Date   TSH 2.060 09/14/2019   No results found for: HGBA1C Lab Results  Component Value Date   WBC 7.4 09/14/2019   HGB 13.7 09/14/2019   HCT 40.4 09/14/2019   MCV 89 09/14/2019   PLT 302 09/14/2019   Lab Results  Component Value Date   ALT 9 09/14/2019   AST 15 09/14/2019   ALKPHOS 48 09/14/2019   BILITOT 0.7 09/14/2019     Review of Systems  Constitutional: Negative.  Negative for chills, fatigue, fever and unexpected weight change.  HENT: Negative for congestion, ear discharge, ear pain, rhinorrhea, sinus pressure, sneezing and sore throat.   Eyes: Negative for photophobia, pain, discharge, redness and itching.  Respiratory: Negative for cough, shortness of breath, wheezing and stridor.   Gastrointestinal: Positive for abdominal pain and diarrhea. Negative for blood in stool, constipation, hematochezia, nausea and vomiting.  Endocrine: Negative for cold intolerance, heat intolerance, polydipsia, polyphagia and polyuria.  Genitourinary: Positive for flank pain, pelvic pain and vaginal bleeding. Negative for dysuria, frequency, hematuria, hesitancy, menstrual problem, missed menses, urgency, vaginal discharge and vaginal pain.  Musculoskeletal: Negative for arthralgias, back pain and myalgias.  Skin: Negative for rash.  Allergic/Immunologic: Negative for environmental allergies and food allergies.  Neurological:  Negative for dizziness, weakness, light-headedness, numbness and headaches.  Hematological: Negative for adenopathy. Does not bruise/bleed easily.  Psychiatric/Behavioral: Negative for dysphoric mood. The patient is not nervous/anxious.     Patient Active Problem List   Diagnosis Date Noted  . High risk human papilloma virus (HPV) infection of cervix 08/02/2017  . Hyperlipidemia, mixed 05/31/2015  . Recurrent genital herpes 05/31/2015  . GERD (gastroesophageal reflux disease) 05/31/2015  . Migraine without aura and without status migrainosus, not intractable 05/31/2015    Allergies  Allergen Reactions  . Ciprofloxacin Other (See Comments)    headache    Past Surgical History:  Procedure Laterality Date  . NO PAST SURGERIES      Social History   Tobacco Use  . Smoking status: Never Smoker  . Smokeless tobacco: Never Used  Vaping Use  . Vaping Use: Every day  Substance Use Topics  . Alcohol use: Yes    Comment: rarely  . Drug use: No     Medication list has been reviewed and updated.  Current Meds  Medication Sig  . medroxyPROGESTERone (DEPO-PROVERA) 150 MG/ML injection Inject 1 mL (150 mg total) into the muscle every 3 (three) months.  . valACYclovir (VALTREX) 1000 MG tablet Take 1 tablet (1,000 mg total) by mouth daily.    PHQ 2/9 Scores 10/14/2019 09/14/2019 03/04/2017 08/31/2015  PHQ - 2 Score 0 0 0 0  PHQ- 9 Score 0 0 - -    GAD 7 : Generalized Anxiety Score 10/14/2019 09/14/2019  Nervous, Anxious, on Edge 0 0  Control/stop worrying 0 0  Worry too much - different things 0 0  Trouble relaxing 0 0  Restless 0 0  Easily annoyed or irritable 0 0  Afraid - awful might happen 0 0  Total GAD 7 Score 0 0  Anxiety Difficulty Not difficult at all Not difficult at all    BP Readings from Last 3 Encounters:  10/14/19 134/78  09/14/19 118/78  07/30/17 104/64    Physical Exam Vitals and nursing note reviewed.  Constitutional:      Appearance: She is  well-developed.  HENT:     Head: Normocephalic.     Right Ear: Tympanic membrane and external ear normal.     Left Ear: Tympanic membrane and external ear normal.     Mouth/Throat:     Mouth: Mucous membranes are moist.  Eyes:     General: Lids are everted, no foreign bodies appreciated. No scleral icterus.       Left eye: No foreign body or hordeolum.     Conjunctiva/sclera: Conjunctivae normal.     Right eye: Right conjunctiva is not injected.     Left eye: Left conjunctiva is not injected.     Pupils: Pupils are equal, round, and reactive to light.  Neck:     Thyroid: No thyromegaly.     Vascular: No JVD.     Trachea: No tracheal deviation.  Cardiovascular:     Rate and Rhythm: Normal rate and regular rhythm.     Heart sounds: Normal heart sounds. No murmur heard.  No friction rub. No gallop.   Pulmonary:     Effort: Pulmonary effort is normal. No respiratory distress.     Breath sounds: Normal breath sounds. No wheezing or rales.  Abdominal:     General: Bowel sounds are normal.  Palpations: Abdomen is soft. There is no mass.     Tenderness: There is abdominal tenderness. There is no right CVA tenderness, left CVA tenderness, guarding or rebound.     Comments: Bowel noises with palpation  Musculoskeletal:        General: No tenderness. Normal range of motion.     Cervical back: Normal range of motion and neck supple.  Lymphadenopathy:     Cervical: No cervical adenopathy.  Skin:    General: Skin is warm.     Findings: No rash.  Neurological:     Mental Status: She is alert and oriented to person, place, and time.     Cranial Nerves: No cranial nerve deficit.     Deep Tendon Reflexes: Reflexes normal.  Psychiatric:        Mood and Affect: Mood is not anxious or depressed.     Wt Readings from Last 3 Encounters:  10/14/19 134 lb (60.8 kg)  09/14/19 138 lb (62.6 kg)  07/30/17 133 lb (60.3 kg)    BP 134/78   Pulse 97   Ht 5' 5.5" (1.664 m)   Wt 134 lb (60.8  kg)   LMP 10/09/2019 (Exact Date)   SpO2 100%   BMI 21.96 kg/m   Assessment and Plan: 1. Pelvic pain in female New onset.  Persistent.  Gradually decreasing in intensity.  Relatively stable but patient's been unable to work the entire week.  This is relatively new status post Depo-Provera injection by nurse 1 month ago.  There is minimal tenderness at the pelvic area with no guarding or rebound.  Review of urinalysis is unremarkable for infection.  Examination of vaginal prep is negative for infection.  Patient had a Depo injection as noted and this may be causing some irregularity from the menstrual.  Patient is concerned about possibility infection or other issue.  Will obtain a CBC and a qualitative hCG (rule out tubal pregnancy). - CBC with Differential/Platelet - hCG, serum, qualitative - Ambulatory referral to Gynecology - POCT Urinalysis Dipstick - POCT Wet Prep with KOH  2. Vagina bleeding New onset.  Light in nature.  Review of side effects of Depo-Provera injection is a possibility of a regular menstrual bleeding.  I think this could be consistent with the patient's diagnosis but she has concerns that it may be of other issues.  It is unlikely that she has pelvic inflammatory disease patient is in a stable relationship nor tubal pregnancy given that she had a Depo-Provera injection 4 weeks ago we will check a qualitative hCG to rule this out. - CBC with Differential/Platelet - hCG, serum, qualitative - Ambulatory referral to Gynecology - POCT Urinalysis Dipstick - POCT Wet Prep with KOH  3. Breakthrough bleeding on depo provera This appears to be the most likely diagnosis and since patient is having second thoughts about continuance of Depo-Provera we will have in addition to evaluation of the discomfort with vaginal spotting would like discussed perhaps alternatives of contraceptive management. - Ambulatory referral to Gynecology

## 2019-10-14 NOTE — Telephone Encounter (Signed)
Do I send her to UC or have her come?   Copied from CRM [id number]. Topic: General - Other >> Oct 14, 2019  8:05 AM Elliot Gault wrote: Reason for CRM: patient has a possible UTI and would like an appointment today

## 2019-10-15 LAB — CBC WITH DIFFERENTIAL/PLATELET
Basophils Absolute: 0.1 10*3/uL (ref 0.0–0.2)
Basos: 1 %
EOS (ABSOLUTE): 0.1 10*3/uL (ref 0.0–0.4)
Eos: 1 %
Hematocrit: 39.3 % (ref 34.0–46.6)
Hemoglobin: 13.4 g/dL (ref 11.1–15.9)
Immature Grans (Abs): 0 10*3/uL (ref 0.0–0.1)
Immature Granulocytes: 0 %
Lymphocytes Absolute: 1.7 10*3/uL (ref 0.7–3.1)
Lymphs: 32 %
MCH: 29.7 pg (ref 26.6–33.0)
MCHC: 34.1 g/dL (ref 31.5–35.7)
MCV: 87 fL (ref 79–97)
Monocytes Absolute: 0.5 10*3/uL (ref 0.1–0.9)
Monocytes: 10 %
Neutrophils Absolute: 2.9 10*3/uL (ref 1.4–7.0)
Neutrophils: 56 %
Platelets: 281 10*3/uL (ref 150–450)
RBC: 4.51 x10E6/uL (ref 3.77–5.28)
RDW: 13.1 % (ref 11.7–15.4)
WBC: 5.3 10*3/uL (ref 3.4–10.8)

## 2019-10-15 LAB — HCG, SERUM, QUALITATIVE: hCG,Beta Subunit,Qual,Serum: NEGATIVE m[IU]/mL (ref <6)

## 2019-10-20 ENCOUNTER — Encounter: Payer: Self-pay | Admitting: Obstetrics and Gynecology

## 2019-10-27 ENCOUNTER — Telehealth: Payer: Self-pay | Admitting: Obstetrics and Gynecology

## 2019-10-27 NOTE — Telephone Encounter (Signed)
[facility] referring for pelvic pain, vaginal bleeding and breakthrough bleeding on depo provera. x2. Called and left voicemail for patient to call back to be scheduled.

## 2019-10-29 NOTE — Telephone Encounter (Signed)
Called and left voicemail for patient to call back to be scheduled. 

## 2019-11-08 NOTE — Telephone Encounter (Signed)
Called and left voicemail for patient to call back to be scheduled. 

## 2019-12-07 ENCOUNTER — Ambulatory Visit: Payer: Managed Care, Other (non HMO)

## 2019-12-13 ENCOUNTER — Other Ambulatory Visit: Payer: Self-pay

## 2019-12-13 ENCOUNTER — Encounter: Payer: Self-pay | Admitting: Internal Medicine

## 2019-12-13 ENCOUNTER — Ambulatory Visit (INDEPENDENT_AMBULATORY_CARE_PROVIDER_SITE_OTHER): Payer: Managed Care, Other (non HMO) | Admitting: Internal Medicine

## 2019-12-13 VITALS — BP 132/70 | HR 88 | Ht 65.5 in | Wt 132.0 lb

## 2019-12-13 DIAGNOSIS — Z3009 Encounter for other general counseling and advice on contraception: Secondary | ICD-10-CM | POA: Diagnosis not present

## 2019-12-13 MED ORDER — ETONOGESTREL-ETHINYL ESTRADIOL 0.12-0.015 MG/24HR VA RING: VAGINAL_RING | VAGINAL | 3 refills | Status: DC

## 2019-12-13 NOTE — Progress Notes (Signed)
Date:  12/13/2019   Name:  Candace Rangel   DOB:  04-26-1975   MRN:  [id number]   Chief Complaint: Contraception (Wants to discuss changing from depo to an alternative BC. Depo caused her to have spotting, severe cramps. )  Pap 09/14/19: normal HPV negative She started Depo Provera in May but in June had severe pain and bleeding that lasted for a week. She was referred to GYN but never went. The sx have now resolved and she does not want to try it again.  After reviewing the options, she would like to try Nuvaring.  She vapes but does not smoke cigarettes, no hx of DVT or stroke. HPI  Lab Results  Component Value Date   CREATININE 0.75 09/14/2019   BUN 14 09/14/2019   NA 138 09/14/2019   K 4.2 09/14/2019   CL 99 09/14/2019   CO2 21 09/14/2019   Lab Results  Component Value Date   CHOL 243 (H) 09/14/2019   HDL 75 09/14/2019   LDLCALC 156 (H) 09/14/2019   TRIG 71 09/14/2019   CHOLHDL 3.2 09/14/2019   Lab Results  Component Value Date   TSH 2.060 09/14/2019   No results found for: HGBA1C Lab Results  Component Value Date   WBC 5.3 10/14/2019   HGB 13.4 10/14/2019   HCT 39.3 10/14/2019   MCV 87 10/14/2019   PLT 281 10/14/2019   Lab Results  Component Value Date   ALT 9 09/14/2019   AST 15 09/14/2019   ALKPHOS 48 09/14/2019   BILITOT 0.7 09/14/2019     Review of Systems  Constitutional: Negative for chills, fatigue and fever.  Respiratory: Negative for cough, chest tightness, shortness of breath and wheezing.   Neurological: Negative for dizziness, weakness, light-headedness and headaches.    Patient Active Problem List   Diagnosis Date Noted  . High risk human papilloma virus (HPV) infection of cervix 08/02/2017  . Hyperlipidemia, mixed 05/31/2015  . Recurrent genital herpes 05/31/2015  . GERD (gastroesophageal reflux disease) 05/31/2015  . Migraine without aura and without status migrainosus, not intractable 05/31/2015    Allergies    Allergen Reactions  . Ciprofloxacin Other (See Comments)    headache    Past Surgical History:  Procedure Laterality Date  . NO PAST SURGERIES      Social History   Tobacco Use  . Smoking status: Never Smoker  . Smokeless tobacco: Never Used  Vaping Use  . Vaping Use: Every day  Substance Use Topics  . Alcohol use: Yes    Comment: rarely  . Drug use: No     Medication list has been reviewed and updated.  Current Meds  Medication Sig  . valACYclovir (VALTREX) 1000 MG tablet Take 1 tablet (1,000 mg total) by mouth daily.    PHQ 2/9 Scores 10/14/2019 09/14/2019 03/04/2017 08/31/2015  PHQ - 2 Score 0 0 0 0  PHQ- 9 Score 0 0 - -    GAD 7 : Generalized Anxiety Score 10/14/2019 09/14/2019  Nervous, Anxious, on Edge 0 0  Control/stop worrying 0 0  Worry too much - different things 0 0  Trouble relaxing 0 0  Restless 0 0  Easily annoyed or irritable 0 0  Afraid - awful might happen 0 0  Total GAD 7 Score 0 0  Anxiety Difficulty Not difficult at all Not difficult at all    BP Readings from Last 3 Encounters:  12/13/19 132/70  10/14/19 134/78  09/14/19 118/78  Physical Exam Vitals and nursing note reviewed.  Constitutional:      General: She is not in acute distress.    Appearance: Normal appearance. She is well-developed.  HENT:     Head: Normocephalic and atraumatic.  Cardiovascular:     Rate and Rhythm: Normal rate and regular rhythm.     Pulses: Normal pulses.     Heart sounds: No murmur heard.   Pulmonary:     Effort: Pulmonary effort is normal. No respiratory distress.     Breath sounds: No wheezing or rhonchi.  Musculoskeletal:        General: Normal range of motion.     Cervical back: Normal range of motion.     Right lower leg: No edema.     Left lower leg: No edema.  Lymphadenopathy:     Cervical: No cervical adenopathy.  Skin:    General: Skin is warm and dry.     Capillary Refill: Capillary refill takes less than 2 seconds.     Findings:  No rash.  Neurological:     General: No focal deficit present.     Mental Status: She is alert and oriented to person, place, and time.  Psychiatric:        Mood and Affect: Mood normal.     Wt Readings from Last 3 Encounters:  12/13/19 132 lb (59.9 kg)  10/14/19 134 lb (60.8 kg)  09/14/19 138 lb (62.6 kg)    BP 132/70   Pulse 88   Ht 5' 5.5" (1.664 m)   Wt 132 lb (59.9 kg)   LMP  (Exact Date) Comment: spotting on and off  SpO2 100%   BMI 21.63 kg/m   Assessment and Plan: 1. Encounter for evaluation regarding contraception options Pt did not tolerated Depo Provera; does not believe that she can remember OCPs daily and is not interested in an IUD or Norplant.   - etonogestrel-ethinyl estradiol (NUVARING) 0.12-0.015 MG/24HR vaginal ring; Insert vaginally and leave in place for 3 consecutive weeks, then remove for 1 week.  Dispense: 3 each; Refill: 3   Partially dictated using Animal nutritionist. Any errors are unintentional.  Bari Edward, MD [facility]  12/13/2019

## 2020-01-26 ENCOUNTER — Other Ambulatory Visit: Payer: Self-pay

## 2020-01-26 ENCOUNTER — Ambulatory Visit (INDEPENDENT_AMBULATORY_CARE_PROVIDER_SITE_OTHER): Payer: Managed Care, Other (non HMO)

## 2020-01-26 ENCOUNTER — Ambulatory Visit
Admission: EM | Admit: 2020-01-26 | Discharge: 2020-01-26 | Disposition: A | Discharge status: Home / Self Care | Payer: Managed Care, Other (non HMO) | Attending: Physician Assistant | Admitting: Physician Assistant

## 2020-01-26 DIAGNOSIS — J1282 Pneumonia due to coronavirus disease 2019: Secondary | ICD-10-CM

## 2020-01-26 DIAGNOSIS — R059 Cough, unspecified: Secondary | ICD-10-CM | POA: Diagnosis not present

## 2020-01-26 DIAGNOSIS — U071 COVID-19: Secondary | ICD-10-CM | POA: Diagnosis not present

## 2020-01-26 MED ORDER — CHERATUSSIN AC 100-10 MG/5ML PO SOLN: 5.0000 mL | Freq: Four times a day (QID) | ORAL | 0 refills | Status: AC | PRN

## 2020-01-26 NOTE — Discharge Instructions (Addendum)
Your x-ray shows that you have COVID pneumonia.  It will take you another week or 2 to recover.  Just increase your fluid intake and rest.  You can take the Cheratussin as needed for cough.  If you start to run a fever or have shortness of breath or weakness you should return or go to ED.  COVID-19 INFECTION: The incubation period of COVID-19 is approximately 14 days after exposure, with most symptoms developing in roughly 4-5 days. Symptoms may range in severity from mild to critically severe. Roughly 80% of those infected will have mild symptoms. People of any age may become infected with COVID-19 and have the ability to transmit the virus. The most common symptoms include: fever, fatigue, cough, body aches, headaches, sore throat, nasal congestion, shortness of breath, nausea, vomiting, diarrhea, changes in smell and/or taste.    COURSE OF ILLNESS Some patients may begin with mild disease which can progress quickly into critical symptoms. If your symptoms are worsening please call ahead to the Emergency Department and proceed there for further treatment. Recovery time appears to be roughly 1-2 weeks for mild symptoms and 3-6 weeks for severe disease.   GO IMMEDIATELY TO ER FOR FEVER YOU ARE UNABLE TO GET DOWN WITH TYLENOL, BREATHING PROBLEMS, CHEST PAIN, FATIGUE, LETHARGY, INABILITY TO EAT OR DRINK, ETC  QUARANTINE AND ISOLATION: To help decrease the spread of COVID-19 please remain isolated if you have COVID infection or are highly suspected to have COVID infection. This means -stay home and isolate to one room in the home if you live with others. Do not share a bed or bathroom with others while ill, sanitize and wipe down all countertops and keep common areas clean and disinfected. You may discontinue isolation if you have a mild case and are asymptomatic 10 days after symptom onset as long as you have been fever free >24 hours without having to take Motrin or Tylenol. If your case is more severe  (meaning you develop pneumonia or are admitted in the hospital), you may have to isolate longer.   If you have been in close contact (within 6 feet) of someone diagnosed with COVID 19, you are advised to quarantine in your home for 14 days as symptoms can develop anywhere from 2-14 days after exposure to the virus. If you develop symptoms, you  must isolate.  Most current guidelines for COVID after exposure -isolate 10 days if you ARE NOT tested for COVID as long as symptoms do not develop -isolate 7 days if you are tested and remain asymptomatic -You do not necessarily need to be tested for COVID if you have + exposure and        develop   symptoms. Just isolate at home x10 days from symptom onset During this global pandemic, CDC advises to practice social distancing, try to stay at least 49ft away from others at all times. Wear a face covering. Wash and sanitize your hands regularly and avoid going anywhere that is not necessary.  KEEP IN MIND THAT THE COVID TEST IS NOT 100% ACCURATE AND YOU SHOULD STILL DO EVERYTHING TO PREVENT POTENTIAL SPREAD OF VIRUS TO OTHERS (WEAR MASK, WEAR GLOVES, WASH HANDS AND SANITIZE REGULARLY). IF INITIAL TEST IS NEGATIVE, THIS MAY NOT MEAN YOU ARE DEFINITELY NEGATIVE. MOST ACCURATE TESTING IS DONE 5-7 DAYS AFTER EXPOSURE.   It is not advised by CDC to get re-tested after receiving a positive COVID test since you can still test positive for weeks to months after you have  already cleared the virus.   *If you have not been vaccinated for COVID, I strongly suggest you consider getting vaccinated as long as there are no contraindications.

## 2020-01-26 NOTE — ED Provider Notes (Signed)
[facility]    CSN: [id number] Arrival date & time: 01/26/20  1052      History   Chief Complaint Chief Complaint  Patient presents with  . Cough    HPI Candace Rangel is a 44 y.o. female presenting for 11-day history of cough.  Patient states that she had a positive at home test for Covid on 01/15/2020.  She had a confirmatory test 3 days later done at [facility].  Patient says that her cough has continued and keeps her up at night. She says the cough is mostly dry. She is tried multiple over-the-counter cough medications without relief. Patient also admits to continued fatigue. She denies any fevers, chest pain or shortness of breath. Denies any upper respiratory symptoms. No abdominal pain, nausea, vomiting, diarrhea. Patient states that most of his symptoms have resolved, but she has persistent cough and fatigue. Admits to occasional headaches. Patient is otherwise healthy without any cardiopulmonary disease. She has no other complaints or concerns today.  HPI  Past Medical History:  Diagnosis Date  . Sprain of anterior cruciate ligament of left knee 05/22/2016   03/2016 sledding accident    Patient Active Problem List   Diagnosis Date Noted  . High risk human papilloma virus (HPV) infection of cervix 08/02/2017  . Hyperlipidemia, mixed 05/31/2015  . Recurrent genital herpes 05/31/2015  . GERD (gastroesophageal reflux disease) 05/31/2015  . Migraine without aura and without status migrainosus, not intractable 05/31/2015    Past Surgical History:  Procedure Laterality Date  . NO PAST SURGERIES      OB History   No obstetric history on file.      Home Medications    Prior to Admission medications   Medication Sig Start Date End Date Taking? Authorizing Provider  etonogestrel-ethinyl estradiol (NUVARING) 0.12-0.015 MG/24HR vaginal ring Insert vaginally and leave in place for 3 consecutive weeks, then remove for 1 week. 12/13/19  Yes Reubin Milan, MD  valACYclovir (VALTREX) 1000 MG tablet Take 1 tablet (1,000 mg total) by mouth daily. 09/14/19  Yes Reubin Milan, MD  guaiFENesin-codeine [facility]) 100-10 MG/5ML syrup Take 5 mLs by mouth 4 (four) times daily as needed for up to 7 days for cough. 01/26/20 02/02/20  Shirlee Latch, PA-C    Family History Family History  Problem Relation Age of Onset  . Migraines Mother   . Hypertension Father     Social History Social History   Tobacco Use  . Smoking status: Never Smoker  . Smokeless tobacco: Never Used  Vaping Use  . Vaping Use: Every day  . Substances: Nicotine, Flavoring  Substance Use Topics  . Alcohol use: Yes    Comment: rarely  . Drug use: No     Allergies   Ciprofloxacin   Review of Systems Review of Systems  Constitutional: Positive for fatigue. Negative for chills, diaphoresis and fever.  HENT: Negative for congestion, ear pain, rhinorrhea, sinus pressure, sinus pain and sore throat.   Respiratory: Positive for cough. Negative for shortness of breath.   Gastrointestinal: Negative for abdominal pain, nausea and vomiting.  Musculoskeletal: Negative for arthralgias and myalgias.  Skin: Negative for rash.  Neurological: Positive for headaches. Negative for weakness.  Hematological: Negative for adenopathy.     Physical Exam Triage Vital Signs ED Triage Vitals  Enc Vitals Group     BP 01/26/20 1211 134/89     Pulse Rate 01/26/20 1211 (!) 105     Resp 01/26/20 1211 20  Temp 01/26/20 1211 99.2 F (37.3 C)     Temp Source 01/26/20 1211 Oral     SpO2 01/26/20 1211 98 %     Weight --      Height --      Head Circumference --      Peak Flow --      Pain Score 01/26/20 1212 3     Pain Loc --      Pain Edu? --      Excl. in GC? --    No data found.  Updated Vital Signs BP 134/89 (BP Location: Left Arm)   Pulse (!) 105   Temp 99.2 F (37.3 C) (Oral)   Resp 20   SpO2 98%        Physical Exam Vitals and nursing note  reviewed.  Constitutional:      General: She is not in acute distress.    Appearance: Normal appearance. She is not ill-appearing, toxic-appearing or diaphoretic.  HENT:     Head: Normocephalic and atraumatic.     Nose: Nose normal.     Mouth/Throat:     Mouth: Mucous membranes are moist.     Pharynx: Oropharynx is clear.  Eyes:     General: No scleral icterus.       Right eye: No discharge.        Left eye: No discharge.     Conjunctiva/sclera: Conjunctivae normal.  Cardiovascular:     Rate and Rhythm: Regular rhythm. Tachycardia present.     Heart sounds: Normal heart sounds.  Pulmonary:     Effort: Pulmonary effort is normal. No respiratory distress.     Breath sounds: Normal breath sounds. No wheezing, rhonchi or rales.  Musculoskeletal:     Cervical back: Neck supple.  Skin:    General: Skin is dry.  Neurological:     General: No focal deficit present.     Mental Status: She is alert. Mental status is at baseline.     Motor: No weakness.     Gait: Gait normal.  Psychiatric:        Mood and Affect: Mood normal.        Behavior: Behavior normal.        Thought Content: Thought content normal.      UC Treatments / Results  Labs (all labs ordered are listed, but only abnormal results are displayed) Labs Reviewed - No data to display  EKG   Radiology DG Chest 2 View  Result Date: 01/26/2020 CLINICAL DATA:  Cough.  COVID-19 positive EXAM: CHEST - 2 VIEW COMPARISON:  None. FINDINGS: Airspace opacity is noted in the periphery of each mid and lower lung region and to a lesser extent in the left upper lobe. Heart size and pulmonary vascularity are normal. No adenopathy. No bone lesions. IMPRESSION: Multiple foci of airspace opacity, likely representing multifocal pneumonia. Suspect atypical organism pneumonia, particularly given the history. Heart size normal. No adenopathy. These results will be called to the ordering clinician or representative by the Radiologist  Assistant, and communication documented in the PACS or [employer]. Electronically Signed   By: Bretta Bang III M.D.   On: 01/26/2020 12:40    Procedures Procedures (including critical care time)  Medications Ordered in UC Medications - No data to display  Initial Impression / Assessment and Plan / UC Course  I have reviewed the triage vital signs and the nursing notes.  Pertinent labs & imaging results that were available during my care of  the patient were reviewed by me and considered in my medical decision making (see chart for details).   Chest x-ray shows multifocal pneumonia likely atypical. This is consistent with Covid pneumonia. All patient's vital signs are stable. Her chest is clear to auscultation. She denies any shortness of breath. At this time advised her that it will take a little bit longer for her to recover. Advised her to increase rest and fluid intake. She can take the Cheratussin cough medication as needed for cough. Advised her that she needs a few more days off work to recover. She should follow-up with our department or ER if she has any fever, chest pain or shortness of breath or any new or worsening symptoms.   Final Clinical Impressions(s) / UC Diagnoses   Final diagnoses:  Pneumonia due to COVID-19 virus  Cough     Discharge Instructions     Your x-ray shows that you have COVID pneumonia.  It will take you another week or 2 to recover.  Just increase your fluid intake and rest.  You can take the Cheratussin as needed for cough.  If you start to run a fever or have shortness of breath or weakness you should return or go to ED.  COVID-19 INFECTION: The incubation period of COVID-19 is approximately 14 days after exposure, with most symptoms developing in roughly 4-5 days. Symptoms may range in severity from mild to critically severe. Roughly 80% of those infected will have mild symptoms. People of any age may become infected with COVID-19 and have  the ability to transmit the virus. The most common symptoms include: fever, fatigue, cough, body aches, headaches, sore throat, nasal congestion, shortness of breath, nausea, vomiting, diarrhea, changes in smell and/or taste.    COURSE OF ILLNESS Some patients may begin with mild disease which can progress quickly into critical symptoms. If your symptoms are worsening please call ahead to the Emergency Department and proceed there for further treatment. Recovery time appears to be roughly 1-2 weeks for mild symptoms and 3-6 weeks for severe disease.   GO IMMEDIATELY TO ER FOR FEVER YOU ARE UNABLE TO GET DOWN WITH TYLENOL, BREATHING PROBLEMS, CHEST PAIN, FATIGUE, LETHARGY, INABILITY TO EAT OR DRINK, ETC  QUARANTINE AND ISOLATION: To help decrease the spread of COVID-19 please remain isolated if you have COVID infection or are highly suspected to have COVID infection. This means -stay home and isolate to one room in the home if you live with others. Do not share a bed or bathroom with others while ill, sanitize and wipe down all countertops and keep common areas clean and disinfected. You may discontinue isolation if you have a mild case and are asymptomatic 10 days after symptom onset as long as you have been fever free >24 hours without having to take Motrin or Tylenol. If your case is more severe (meaning you develop pneumonia or are admitted in the hospital), you may have to isolate longer.   If you have been in close contact (within 6 feet) of someone diagnosed with COVID 19, you are advised to quarantine in your home for 14 days as symptoms can develop anywhere from 2-14 days after exposure to the virus. If you develop symptoms, you  must isolate.  Most current guidelines for COVID after exposure -isolate 10 days if you ARE NOT tested for COVID as long as symptoms do not develop -isolate 7 days if you are tested and remain asymptomatic -You do not necessarily need to be tested for  COVID if you have +  exposure and        develop   symptoms. Just isolate at home x10 days from symptom onset During this global pandemic, CDC advises to practice social distancing, try to stay at least 17ft away from others at all times. Wear a face covering. Wash and sanitize your hands regularly and avoid going anywhere that is not necessary.  KEEP IN MIND THAT THE COVID TEST IS NOT 100% ACCURATE AND YOU SHOULD STILL DO EVERYTHING TO PREVENT POTENTIAL SPREAD OF VIRUS TO OTHERS (WEAR MASK, WEAR GLOVES, WASH HANDS AND SANITIZE REGULARLY). IF INITIAL TEST IS NEGATIVE, THIS MAY NOT MEAN YOU ARE DEFINITELY NEGATIVE. MOST ACCURATE TESTING IS DONE 5-7 DAYS AFTER EXPOSURE.   It is not advised by CDC to get re-tested after receiving a positive COVID test since you can still test positive for weeks to months after you have already cleared the virus.   *If you have not been vaccinated for COVID, I strongly suggest you consider getting vaccinated as long as there are no contraindications.      ED Prescriptions    Medication Sig Dispense Auth. Provider   guaiFENesin-codeine (CHERATUSSIN AC) 100-10 MG/5ML syrup Take 5 mLs by mouth 4 (four) times daily as needed for up to 7 days for cough. 140 mL Shirlee Latch, PA-C     PDMP not reviewed this encounter.   Shirlee Latch, PA-C 01/26/20 1326

## 2020-01-26 NOTE — ED Triage Notes (Signed)
Pt tested positive for COVID on 09/25 (home test) and 09/28 ([facility]).  Most symptoms have improved but has lingering cough that keeps her up at night.  Has been using OTC cough med at night which isn't helping.  Denies SOB.

## 2020-09-19 ENCOUNTER — Ambulatory Visit (INDEPENDENT_AMBULATORY_CARE_PROVIDER_SITE_OTHER): Payer: Managed Care, Other (non HMO) | Admitting: Internal Medicine

## 2020-09-19 ENCOUNTER — Other Ambulatory Visit: Payer: Self-pay

## 2020-09-19 ENCOUNTER — Encounter: Payer: Managed Care, Other (non HMO) | Admitting: Internal Medicine

## 2020-09-19 ENCOUNTER — Encounter: Payer: Self-pay | Admitting: Internal Medicine

## 2020-09-19 VITALS — BP 128/86 | HR 74 | Temp 98.3°F | Ht 65.5 in | Wt 133.0 lb

## 2020-09-19 DIAGNOSIS — Z3044 Encounter for surveillance of vaginal ring hormonal contraceptive device: Secondary | ICD-10-CM

## 2020-09-19 DIAGNOSIS — E782 Mixed hyperlipidemia: Secondary | ICD-10-CM

## 2020-09-19 DIAGNOSIS — Z8619 Personal history of other infectious and parasitic diseases: Secondary | ICD-10-CM

## 2020-09-19 DIAGNOSIS — Z1159 Encounter for screening for other viral diseases: Secondary | ICD-10-CM

## 2020-09-19 DIAGNOSIS — R002 Palpitations: Secondary | ICD-10-CM

## 2020-09-19 DIAGNOSIS — Z0000 Encounter for general adult medical examination without abnormal findings: Secondary | ICD-10-CM | POA: Diagnosis not present

## 2020-09-19 DIAGNOSIS — Z1231 Encounter for screening mammogram for malignant neoplasm of breast: Secondary | ICD-10-CM | POA: Diagnosis not present

## 2020-09-19 MED ORDER — ETONOGESTREL-ETHINYL ESTRADIOL 0.12-0.015 MG/24HR VA RING: VAGINAL_RING | VAGINAL | 3 refills | Status: DC

## 2020-09-19 NOTE — Patient Instructions (Signed)
To reduce palpitations: limit caffeine and other stimulants; drink plenty of fluids.  For situational anxiety:  THC-free CBD supplements Try the [employer] in [place] for qualified advice and options.

## 2020-09-19 NOTE — Progress Notes (Signed)
Date:  09/19/2020   Name:  Candace Rangel   DOB:  1975-06-06   MRN:  [id number]   Chief Complaint: Annual Exam Candace Rangel is a 45 y.o. female who presents today for her Complete Annual Exam. She feels well. She reports exercising walking at work. She reports she is sleeping well. Breast complaints none. Doing well with the nuvaring - normal menses, no side effects.  Mammogram:  due DEXA: none Pap smear: 08/2019 neg with cotesting   There is no immunization history on file for this patient.   Anxiety Presents for initial visit. Onset was at an unknown time. The problem has been unchanged. Symptoms include nervous/anxious behavior and palpitations. Patient reports no chest pain, dizziness or shortness of breath. Symptoms occur occasionally. The severity of symptoms is mild. The symptoms are aggravated by social activities (worse with going to the grocery or driving).    Palpitations  This is a new problem. The current episode started more than 1 month ago. The problem occurs intermittently. The problem has been unchanged. On average, each episode lasts 5 seconds. The symptoms are aggravated by caffeine (drinks one coffee in the am then water). Associated symptoms include anxiety. Pertinent negatives include no chest pain, coughing, dizziness, fever, shortness of breath or vomiting. She has tried nothing for the symptoms. There are no known risk factors.    Lab Results  Component Value Date   CREATININE 0.75 09/14/2019   BUN 14 09/14/2019   NA 138 09/14/2019   K 4.2 09/14/2019   CL 99 09/14/2019   CO2 21 09/14/2019   Lab Results  Component Value Date   CHOL 243 (H) 09/14/2019   HDL 75 09/14/2019   LDLCALC 156 (H) 09/14/2019   TRIG 71 09/14/2019   CHOLHDL 3.2 09/14/2019   Lab Results  Component Value Date   TSH 2.060 09/14/2019   No results found for: HGBA1C Lab Results  Component Value Date   WBC 5.3 10/14/2019   HGB 13.4 10/14/2019   HCT  39.3 10/14/2019   MCV 87 10/14/2019   PLT 281 10/14/2019   Lab Results  Component Value Date   ALT 9 09/14/2019   AST 15 09/14/2019   ALKPHOS 48 09/14/2019   BILITOT 0.7 09/14/2019     Review of Systems  Constitutional: Negative for chills, fatigue and fever.  HENT: Negative for congestion, hearing loss, tinnitus, trouble swallowing and voice change.   Eyes: Negative for visual disturbance.  Respiratory: Negative for cough, chest tightness, shortness of breath and wheezing.   Cardiovascular: Positive for palpitations. Negative for chest pain and leg swelling.  Gastrointestinal: Negative for abdominal pain, constipation, diarrhea and vomiting.  Endocrine: Negative for polydipsia and polyuria.  Genitourinary: Negative for dysuria, frequency, genital sores and menstrual problem.  Musculoskeletal: Negative for arthralgias, gait problem and joint swelling.  Skin: Negative for color change and rash.  Neurological: Negative for dizziness, tremors, light-headedness and headaches.  Hematological: Negative for adenopathy. Does not bruise/bleed easily.  Psychiatric/Behavioral: Negative for dysphoric mood and sleep disturbance. The patient is nervous/anxious.     Patient Active Problem List   Diagnosis Date Noted  . History of infection due to human papilloma virus (HPV) 08/02/2017  . Hyperlipidemia, mixed 05/31/2015  . Recurrent genital herpes 05/31/2015  . GERD (gastroesophageal reflux disease) 05/31/2015  . Migraine without aura and without status migrainosus, not intractable 05/31/2015    Allergies  Allergen Reactions  . Ciprofloxacin Other (See Comments)    headache  Past Surgical History:  Procedure Laterality Date  . NO PAST SURGERIES      Social History   Tobacco Use  . Smoking status: Never Smoker  . Smokeless tobacco: Never Used  Vaping Use  . Vaping Use: Every day  . Substances: Nicotine, Flavoring  Substance Use Topics  . Alcohol use: Yes    Comment:  rarely  . Drug use: No     Medication list has been reviewed and updated.  Current Meds  Medication Sig  . valACYclovir (VALTREX) 1000 MG tablet Take 1 tablet (1,000 mg total) by mouth daily.  . [DISCONTINUED] etonogestrel-ethinyl estradiol (NUVARING) 0.12-0.015 MG/24HR vaginal ring Insert vaginally and leave in place for 3 consecutive weeks, then remove for 1 week.    PHQ 2/9 Scores 09/19/2020 10/14/2019 09/14/2019 03/04/2017  PHQ - 2 Score 0 0 0 0  PHQ- 9 Score 0 0 0 -    GAD 7 : Generalized Anxiety Score 09/19/2020 10/14/2019 09/14/2019  Nervous, Anxious, on Edge 3 0 0  Control/stop worrying 1 0 0  Worry too much - different things 1 0 0  Trouble relaxing 0 0 0  Restless 0 0 0  Easily annoyed or irritable 0 0 0  Afraid - awful might happen 0 0 0  Total GAD 7 Score 5 0 0  Anxiety Difficulty - Not difficult at all Not difficult at all    BP Readings from Last 3 Encounters:  09/19/20 128/86  01/26/20 134/89  12/13/19 132/70    Physical Exam Vitals and nursing note reviewed.  Constitutional:      General: She is not in acute distress.    Appearance: She is well-developed.  HENT:     Head: Normocephalic and atraumatic.     Right Ear: Tympanic membrane and ear canal normal.     Left Ear: Tympanic membrane and ear canal normal.     Nose:     Right Sinus: No maxillary sinus tenderness.     Left Sinus: No maxillary sinus tenderness.  Eyes:     General: No scleral icterus.       Right eye: No discharge.        Left eye: No discharge.     Conjunctiva/sclera: Conjunctivae normal.  Neck:     Thyroid: No thyromegaly.     Vascular: No carotid bruit.  Cardiovascular:     Rate and Rhythm: Normal rate and regular rhythm. Frequent extrasystoles are present.    Pulses: Normal pulses.     Heart sounds: Normal heart sounds.  Pulmonary:     Effort: Pulmonary effort is normal. No respiratory distress.     Breath sounds: No wheezing.  Chest:  Breasts:     Right: No mass, nipple  discharge, skin change or tenderness.     Left: No mass, nipple discharge, skin change or tenderness.    Abdominal:     General: Bowel sounds are normal.     Palpations: Abdomen is soft.     Tenderness: There is no abdominal tenderness.  Musculoskeletal:     Cervical back: Normal range of motion. No erythema.     Right lower leg: No edema.     Left lower leg: No edema.  Lymphadenopathy:     Cervical: No cervical adenopathy.  Skin:    General: Skin is warm and dry.     Findings: No rash.  Neurological:     Mental Status: She is alert and oriented to person, place, and time.  Cranial Nerves: No cranial nerve deficit.     Sensory: No sensory deficit.     Deep Tendon Reflexes: Reflexes are normal and symmetric.  Psychiatric:        Attention and Perception: Attention normal.        Mood and Affect: Mood normal.     Wt Readings from Last 3 Encounters:  09/19/20 133 lb (60.3 kg)  12/13/19 132 lb (59.9 kg)  10/14/19 134 lb (60.8 kg)    BP 128/86   Pulse 74   Temp 98.3 F (36.8 C) (Oral)   Ht 5' 5.5" (1.664 m)   Wt 133 lb (60.3 kg)   SpO2 99%   BMI 21.80 kg/m   Assessment and Plan: 1. Annual physical exam Normal exam; continue physical activity and healthy diet - CBC with Differential/Platelet  2. Encounter for screening mammogram for breast cancer Schedule at [facility] - MM 3D SCREEN BREAST BILATERAL; Future  3. Heart palpitations And mild anxiety - could be related. Limit caffeine, continue to get sufficient sleep and fluids daily Can try CBD supplements as needed Follow up if worsening - Comprehensive metabolic panel - TSH - EKG 12-Lead NSR @ 71; WNL  4. Hyperlipidemia, mixed Check labs and advise - Lipid panel  5. Need for hepatitis C screening test - Hepatitis C antibody  6. Encounter for surveillance of vaginal ring hormonal contraceptive device Doing very well on Nuvaring.  No side effects and menses are regular. - etonogestrel-ethinyl estradiol  (NUVARING) 0.12-0.015 MG/24HR vaginal ring; Insert vaginally and leave in place for 3 consecutive weeks, then remove for 1 week.  Dispense: 3 each; Refill: 3  7. History of infection due to human papilloma virus (HPV) Last Pap neg along with negative HPV Pt declines STI testing   Partially dictated using Animal nutritionist. Any errors are unintentional.  Bari Edward, MD [facility]  09/19/2020

## 2020-09-20 LAB — CBC WITH DIFFERENTIAL/PLATELET
Basophils Absolute: 0.1 10*3/uL (ref 0.0–0.2)
Basos: 1 %
EOS (ABSOLUTE): 0 10*3/uL (ref 0.0–0.4)
Eos: 0 %
Hematocrit: 40.4 % (ref 34.0–46.6)
Hemoglobin: 13.6 g/dL (ref 11.1–15.9)
Immature Grans (Abs): 0 10*3/uL (ref 0.0–0.1)
Immature Granulocytes: 0 %
Lymphocytes Absolute: 2.6 10*3/uL (ref 0.7–3.1)
Lymphs: 29 %
MCH: 30 pg (ref 26.6–33.0)
MCHC: 33.7 g/dL (ref 31.5–35.7)
MCV: 89 fL (ref 79–97)
Monocytes Absolute: 0.7 10*3/uL (ref 0.1–0.9)
Monocytes: 8 %
Neutrophils Absolute: 5.5 10*3/uL (ref 1.4–7.0)
Neutrophils: 62 %
Platelets: 336 10*3/uL (ref 150–450)
RBC: 4.54 x10E6/uL (ref 3.77–5.28)
RDW: 12.9 % (ref 11.7–15.4)
WBC: 8.8 10*3/uL (ref 3.4–10.8)

## 2020-09-20 LAB — COMPREHENSIVE METABOLIC PANEL
ALT: 8 IU/L (ref 0–32)
AST: 15 IU/L (ref 0–40)
Albumin/Globulin Ratio: 1.8 (ref 1.2–2.2)
Albumin: 4.4 g/dL (ref 3.8–4.8)
Alkaline Phosphatase: 53 IU/L (ref 44–121)
BUN/Creatinine Ratio: 11 (ref 9–23)
BUN: 9 mg/dL (ref 6–24)
Bilirubin Total: 0.5 mg/dL (ref 0.0–1.2)
CO2: 20 mmol/L (ref 20–29)
Calcium: 9.4 mg/dL (ref 8.7–10.2)
Chloride: 101 mmol/L (ref 96–106)
Creatinine, Ser: 0.82 mg/dL (ref 0.57–1.00)
Globulin, Total: 2.5 g/dL (ref 1.5–4.5)
Glucose: 80 mg/dL (ref 65–99)
Potassium: 3.9 mmol/L (ref 3.5–5.2)
Sodium: 141 mmol/L (ref 134–144)
Total Protein: 6.9 g/dL (ref 6.0–8.5)
eGFR: 92 mL/min/{1.73_m2} (ref >59)

## 2020-09-20 LAB — TSH: TSH: 7.18 u[IU]/mL — ABNORMAL HIGH (ref 0.450–4.500)

## 2020-09-20 LAB — LIPID PANEL
Chol/HDL Ratio: 3.5 ratio (ref 0.0–4.4)
Cholesterol, Total: 240 mg/dL — ABNORMAL HIGH (ref 100–199)
HDL: 69 mg/dL (ref >39)
LDL Chol Calc (NIH): 148 mg/dL — ABNORMAL HIGH (ref 0–99)
Triglycerides: 130 mg/dL (ref 0–149)
VLDL Cholesterol Cal: 23 mg/dL (ref 5–40)

## 2020-09-20 LAB — HEPATITIS C ANTIBODY: Hep C Virus Ab: <0.1 s/co ratio (ref 0.0–0.9)

## 2020-09-22 LAB — SPECIMEN STATUS REPORT

## 2020-09-22 LAB — T4: T4, Total: 10.8 ug/dL (ref 4.5–12.0)

## 2020-09-22 LAB — T3: T3, Total: 213 ng/dL — ABNORMAL HIGH (ref 71–180)

## 2020-09-25 ENCOUNTER — Other Ambulatory Visit: Payer: Self-pay

## 2020-09-25 DIAGNOSIS — E0590 Thyrotoxicosis, unspecified without thyrotoxic crisis or storm: Secondary | ICD-10-CM

## 2020-11-28 ENCOUNTER — Other Ambulatory Visit: Payer: Self-pay | Admitting: Internal Medicine

## 2020-11-28 DIAGNOSIS — Z3044 Encounter for surveillance of vaginal ring hormonal contraceptive device: Secondary | ICD-10-CM

## 2021-01-01 ENCOUNTER — Encounter: Payer: Self-pay | Admitting: Emergency Medicine

## 2021-01-01 ENCOUNTER — Ambulatory Visit
Admission: EM | Admit: 2021-01-01 | Discharge: 2021-01-01 | Disposition: A | Discharge status: Home / Self Care | Payer: Managed Care, Other (non HMO) | Attending: Physician Assistant | Admitting: Physician Assistant

## 2021-01-01 ENCOUNTER — Other Ambulatory Visit: Payer: Self-pay

## 2021-01-01 DIAGNOSIS — Z20822 Contact with and (suspected) exposure to covid-19: Secondary | ICD-10-CM | POA: Diagnosis not present

## 2021-01-01 DIAGNOSIS — M791 Myalgia, unspecified site: Secondary | ICD-10-CM | POA: Diagnosis not present

## 2021-01-01 DIAGNOSIS — R059 Cough, unspecified: Secondary | ICD-10-CM | POA: Diagnosis present

## 2021-01-01 DIAGNOSIS — B349 Viral infection, unspecified: Secondary | ICD-10-CM | POA: Diagnosis present

## 2021-01-01 LAB — SARS CORONAVIRUS 2 (TAT 6-24 HRS): SARS Coronavirus 2: POSITIVE — AB

## 2021-01-01 MED ORDER — PSEUDOEPH-BROMPHEN-DM 30-2-10 MG/5ML PO SYRP: 10.0000 mL | ORAL_SOLUTION | Freq: Four times a day (QID) | ORAL | 0 refills | Status: AC | PRN

## 2021-01-01 NOTE — ED Provider Notes (Signed)
[facility]    CSN: [id number] Arrival date & time: 01/01/21  1058      History   Chief Complaint Chief Complaint  Patient presents with   Headache   Generalized Body Aches   Facial Pain    HPI Candace Rangel is a 45 y.o. female presenting for onset of headaches, fatigue, body aches, nasal congestion/pressure, cough and sore throat yesterday.  Patient has been exposed to COVID-19 through her son.  Patient took a home COVID test which was negative today.  She has also taken Excedrin for her headache.  She has not been vaccinated for COVID 19.  She does have history of COVID-19 in October 2022 resulting in COVID-pneumonia.  Patient does not report any chest pain, shortness of breath, nausea/vomiting or diarrhea.  She has no other complaints.  HPI  Past Medical History:  Diagnosis Date   Sprain of anterior cruciate ligament of left knee 05/22/2016   03/2016 sledding accident    Patient Active Problem List   Diagnosis Date Noted   History of infection due to human papilloma virus (HPV) 08/02/2017   Hyperlipidemia, mixed 05/31/2015   Recurrent genital herpes 05/31/2015   GERD (gastroesophageal reflux disease) 05/31/2015   Migraine without aura and without status migrainosus, not intractable 05/31/2015    Past Surgical History:  Procedure Laterality Date   NO PAST SURGERIES      OB History   No obstetric history on file.      Home Medications    Prior to Admission medications   Medication Sig Start Date End Date Taking? Authorizing Provider  brompheniramine-pseudoephedrine-DM 30-2-10 MG/5ML syrup Take 10 mLs by mouth 4 (four) times daily as needed for up to 7 days. 01/01/21 01/08/21 Yes Shirlee LatchEaves, Jaedyn Lard B, PA-C  etonogestrel-ethinyl estradiol (NUVARING) 0.12-0.015 MG/24HR vaginal ring INSERT VAGINALLY AND LEAVE IN PLACE FOR 3 CONSECUTIVE WEEKS, THEN REMOVE FOR 1 WEEK 11/28/20   Reubin MilanBerglund, Laura H, MD  valACYclovir (VALTREX) 1000 MG tablet Take 1 tablet  (1,000 mg total) by mouth daily. 09/14/19   Reubin MilanBerglund, Laura H, MD    Family History Family History  Problem Relation Age of Onset   Migraines Mother    Hypertension Father     Social History Social History   Tobacco Use   Smoking status: Never   Smokeless tobacco: Never  Vaping Use   Vaping Use: Every day   Substances: Nicotine, Flavoring  Substance Use Topics   Alcohol use: Yes    Comment: rarely   Drug use: No     Allergies   Ciprofloxacin   Review of Systems Review of Systems  Constitutional:  Positive for fatigue. Negative for chills, diaphoresis and fever.  HENT:  Positive for congestion, rhinorrhea and sore throat. Negative for ear pain, sinus pressure and sinus pain.   Respiratory:  Positive for cough. Negative for shortness of breath.   Cardiovascular:  Negative for chest pain.  Gastrointestinal:  Negative for abdominal pain, nausea and vomiting.  Musculoskeletal:  Positive for myalgias. Negative for arthralgias.  Skin:  Negative for rash.  Neurological:  Positive for headaches. Negative for weakness.  Hematological:  Negative for adenopathy.    Physical Exam Triage Vital Signs ED Triage Vitals  Enc Vitals Group     BP 01/01/21 1149 (!) 125/95     Pulse Rate 01/01/21 1149 96     Resp 01/01/21 1149 18     Temp 01/01/21 1149 98.6 F (37 C)     Temp Source 01/01/21 1149  Oral     SpO2 01/01/21 1149 100 %     Weight --      Height --      Head Circumference --      Peak Flow --      Pain Score 01/01/21 1146 3     Pain Loc --      Pain Edu? --      Excl. in GC? --    No data found.  Updated Vital Signs BP (!) 125/95 (BP Location: Right Arm)   Pulse 96   Temp 98.6 F (37 C) (Oral)   Resp 18   SpO2 100%      Physical Exam Vitals and nursing note reviewed.  Constitutional:      General: She is not in acute distress.    Appearance: Normal appearance. She is not ill-appearing or toxic-appearing.  HENT:     Head: Normocephalic and atraumatic.      Nose: Congestion present.     Mouth/Throat:     Mouth: Mucous membranes are moist.     Pharynx: Oropharynx is clear. Posterior oropharyngeal erythema present.  Eyes:     General: No scleral icterus.       Right eye: No discharge.        Left eye: No discharge.     Conjunctiva/sclera: Conjunctivae normal.  Cardiovascular:     Rate and Rhythm: Normal rate and regular rhythm.     Heart sounds: Normal heart sounds.  Pulmonary:     Effort: Pulmonary effort is normal. No respiratory distress.     Breath sounds: Normal breath sounds.  Musculoskeletal:     Cervical back: Neck supple.  Skin:    General: Skin is dry.  Neurological:     General: No focal deficit present.     Mental Status: She is alert. Mental status is at baseline.     Motor: No weakness.     Gait: Gait normal.  Psychiatric:        Mood and Affect: Mood normal.        Behavior: Behavior normal.        Thought Content: Thought content normal.     UC Treatments / Results  Labs (all labs ordered are listed, but only abnormal results are displayed) Labs Reviewed  SARS CORONAVIRUS 2 (TAT 6-24 HRS)    EKG   Radiology No results found.  Procedures Procedures (including critical care time)  Medications Ordered in UC Medications - No data to display  Initial Impression / Assessment and Plan / UC Course  I have reviewed the triage vital signs and the nursing notes.  Pertinent labs & imaging results that were available during my care of the patient were reviewed by me and considered in my medical decision making (see chart for details).  45 year old female presenting for onset of fatigue, body aches, cough, congestion, headaches and sore throat yesterday.  Exposed to COVID-19 through her son.  Negative at home COVID test.  Not vaccinated for COVID-19.  History of COVID-19 1 year ago.  Vitals are all stable.  She is overall well-appearing.  Exam significant for mild posterior pharyngeal erythema and  congestion.  Chest is clear to auscultation heart regular rate and rhythm.  PCR COVID test obtained.  Current CDC guidelines, isolation protocol and ED precautions reviewed with patient.  Advised patient symptoms consistent with viral illness and high suspicion for COVID-19 given her exposure.  Sent Bromfed-DM.  Work note provided.  Reviewed return to ED precautions.  Final Clinical Impressions(s) / UC Diagnoses   Final diagnoses:  Viral illness  Myalgia  Cough  Exposure to COVID-19 virus     Discharge Instructions      URI/COLD SYMPTOMS: Your exam today is consistent with a viral illness. Antibiotics are not indicated at this time. Use medications as directed, including cough syrup, nasal saline, and decongestants. Your symptoms should improve over the next few days and resolve within 7-10 days. Increase rest and fluids. F/u if symptoms worsen or predominate such as sore throat, ear pain, productive cough, shortness of breath, or if you develop high fevers or worsening fatigue over the next several days.    You have received COVID testing today either for positive exposure, concerning symptoms that could be related to COVID infection, screening purposes, or re-testing after confirmed positive.  Your test obtained today checks for active viral infection in the last 1-2 weeks. If your test is negative now, you can still test positive later. So, if you do develop symptoms you should either get re-tested and/or isolate x 5 days and then strict mask use x 5 days (unvaccinated) or mask use x 10 days (vaccinated). Please follow CDC guidelines.  While Rapid antigen tests come back in 15-20 minutes, send out PCR/molecular test results typically come back within 1-3 days. In the mean time, if you are symptomatic, assume this could be a positive test and treat/monitor yourself as if you do have COVID.   We will call with test results if positive. Please download the MyChart app and set up a profile to  access test results.   If symptomatic, go home and rest. Push fluids. Take Tylenol as needed for discomfort. Gargle warm salt water. Throat lozenges. Take Mucinex DM or Robitussin for cough. Humidifier in bedroom to ease coughing. Warm showers. Also review the COVID handout for more information.  COVID-19 INFECTION: The incubation period of COVID-19 is approximately 14 days after exposure, with most symptoms developing in roughly 4-5 days. Symptoms may range in severity from mild to critically severe. Roughly 80% of those infected will have mild symptoms. People of any age may become infected with COVID-19 and have the ability to transmit the virus. The most common symptoms include: fever, fatigue, cough, body aches, headaches, sore throat, nasal congestion, shortness of breath, nausea, vomiting, diarrhea, changes in smell and/or taste.    COURSE OF ILLNESS Some patients may begin with mild disease which can progress quickly into critical symptoms. If your symptoms are worsening please call ahead to the Emergency Department and proceed there for further treatment. Recovery time appears to be roughly 1-2 weeks for mild symptoms and 3-6 weeks for severe disease.   GO IMMEDIATELY TO ER FOR FEVER YOU ARE UNABLE TO GET DOWN WITH TYLENOL, BREATHING PROBLEMS, CHEST PAIN, FATIGUE, LETHARGY, INABILITY TO EAT OR DRINK, ETC  QUARANTINE AND ISOLATION: To help decrease the spread of COVID-19 please remain isolated if you have COVID infection or are highly suspected to have COVID infection. This means -stay home and isolate to one room in the home if you live with others. Do not share a bed or bathroom with others while ill, sanitize and wipe down all countertops and keep common areas clean and disinfected. Stay home for 5 days. If you have no symptoms or your symptoms are resolving after 5 days, you can leave your house. Continue to wear a mask around others for 5 additional days. If you have been in close contact  (within 6 feet) of someone  diagnosed with COVID 19, you are advised to quarantine in your home for 14 days as symptoms can develop anywhere from 2-14 days after exposure to the virus. If you develop symptoms, you  must isolate.  Most current guidelines for COVID after exposure -unvaccinated: isolate 5 days and strict mask use x 5 days. Test on day 5 is possible -vaccinated: wear mask x 10 days if symptoms do not develop -You do not necessarily need to be tested for COVID if you have + exposure and  develop symptoms. Just isolate at home x10 days from symptom onset During this global pandemic, CDC advises to practice social distancing, try to stay at least 71ft away from others at all times. Wear a face covering. Wash and sanitize your hands regularly and avoid going anywhere that is not necessary.  KEEP IN MIND THAT THE COVID TEST IS NOT 100% ACCURATE AND YOU SHOULD STILL DO EVERYTHING TO PREVENT POTENTIAL SPREAD OF VIRUS TO OTHERS (WEAR MASK, WEAR GLOVES, WASH HANDS AND SANITIZE REGULARLY). IF INITIAL TEST IS NEGATIVE, THIS MAY NOT MEAN YOU ARE DEFINITELY NEGATIVE. MOST ACCURATE TESTING IS DONE 5-7 DAYS AFTER EXPOSURE.   It is not advised by CDC to get re-tested after receiving a positive COVID test since you can still test positive for weeks to months after you have already cleared the virus.   *If you have not been vaccinated for COVID, I strongly suggest you consider getting vaccinated as long as there are no contraindications.       ED Prescriptions     Medication Sig Dispense Auth. Provider   brompheniramine-pseudoephedrine-DM 30-2-10 MG/5ML syrup Take 10 mLs by mouth 4 (four) times daily as needed for up to 7 days. 150 mL Shirlee Latch, PA-C      PDMP not reviewed this encounter.   Shirlee Latch, PA-C 01/01/21 1247

## 2021-01-01 NOTE — Discharge Instructions (Addendum)

## 2021-01-01 NOTE — ED Triage Notes (Addendum)
Pt presents today with c/o of nasal congestion/pressure, headache and sore throat. She reports being exposed to Covid. Home Covid test neg. She took Excedrin pta.

## 2021-05-14 ENCOUNTER — Ambulatory Visit
Admission: EM | Admit: 2021-05-14 | Discharge: 2021-05-14 | Disposition: A | Discharge status: Home / Self Care | Payer: BC Managed Care – PPO | Attending: Emergency Medicine | Admitting: Emergency Medicine

## 2021-05-14 ENCOUNTER — Other Ambulatory Visit: Payer: Self-pay

## 2021-05-14 DIAGNOSIS — Z20822 Contact with and (suspected) exposure to covid-19: Secondary | ICD-10-CM | POA: Insufficient documentation

## 2021-05-14 DIAGNOSIS — R059 Cough, unspecified: Secondary | ICD-10-CM | POA: Diagnosis not present

## 2021-05-14 DIAGNOSIS — J029 Acute pharyngitis, unspecified: Secondary | ICD-10-CM | POA: Insufficient documentation

## 2021-05-14 DIAGNOSIS — J069 Acute upper respiratory infection, unspecified: Secondary | ICD-10-CM | POA: Diagnosis not present

## 2021-05-14 DIAGNOSIS — F17290 Nicotine dependence, other tobacco product, uncomplicated: Secondary | ICD-10-CM | POA: Insufficient documentation

## 2021-05-14 LAB — RAPID INFLUENZA A&B ANTIGENS
Influenza A (ARMC): NEGATIVE
Influenza B (ARMC): NEGATIVE

## 2021-05-14 MED ORDER — PROMETHAZINE-DM 6.25-15 MG/5ML PO SYRP: 5.0000 mL | ORAL_SOLUTION | Freq: Four times a day (QID) | ORAL | 0 refills | Status: DC | PRN

## 2021-05-14 MED ORDER — BENZONATATE 100 MG PO CAPS: 200.0000 mg | ORAL_CAPSULE | Freq: Three times a day (TID) | ORAL | 0 refills | Status: DC

## 2021-05-14 MED ORDER — IPRATROPIUM BROMIDE 0.06 % NA SOLN: 2.0000 | Freq: Four times a day (QID) | NASAL | 12 refills | Status: DC

## 2021-05-14 NOTE — Discharge Instructions (Signed)
Isolate at home pending the results of your COVID test.  If you test positive then you will have to quarantine for 5 days from the start of your symptoms.  After 5 days you can break quarantine if your symptoms have improved and you have not had a fever for 24 hours without taking Tylenol or ibuprofen. ° °Use over-the-counter Tylenol and ibuprofen as needed for body aches and fever. ° °Use the Atrovent nasal spray, 2 squirts in each nostril every 6 hours, as needed for runny nose and postnasal drip. ° °Use the Tessalon Perles every 8 hours during the day.  Take them with a small sip of water.  They may give you some numbness to the base of your tongue or a metallic taste in your mouth, this is normal. ° °Use the Promethazine DM cough syrup at bedtime for cough and congestion.  It will make you drowsy so do not take it during the day. ° °If you develop any increased shortness of breath-especially at rest, you are unable to speak in full sentences, or is a late sign your lips are turning blue you need to go the ER for evaluation.  °

## 2021-05-14 NOTE — ED Triage Notes (Signed)
Patient presents to Urgent Care with complaints of cough, body aches, and headache since yesterday. Treating symptoms with alka-seltzer.   Denies fever.

## 2021-05-14 NOTE — ED Provider Notes (Signed)
[facility]    CSN: [id number] Arrival date & time: 05/14/21  1554      History   Chief Complaint Chief Complaint  Patient presents with   Headache   Cough   Generalized Body Aches          HPI Candace Rangel is a 46 y.o. female.   HPI  46 year old female here for evaluation of respiratory complaints.  Patient reports that she has been experiencing sore throat, headache, body aches, and a nonproductive cough since yesterday morning.  She denies any fever, runny nose nasal congestion, shortness breath or wheezing, ear pain, or GI complaints.  She has been using over-the-counter Alka-Seltzer without any improvement of symptoms.  She states she was exposed to an individual with known influenza.  Past Medical History:  Diagnosis Date   Sprain of anterior cruciate ligament of left knee 05/22/2016   03/2016 sledding accident    Patient Active Problem List   Diagnosis Date Noted   History of infection due to human papilloma virus (HPV) 08/02/2017   Hyperlipidemia, mixed 05/31/2015   Recurrent genital herpes 05/31/2015   GERD (gastroesophageal reflux disease) 05/31/2015   Migraine without aura and without status migrainosus, not intractable 05/31/2015    Past Surgical History:  Procedure Laterality Date   NO PAST SURGERIES      OB History   No obstetric history on file.      Home Medications    Prior to Admission medications   Medication Sig Start Date End Date Taking? Authorizing Provider  benzonatate (TESSALON) 100 MG capsule Take 2 capsules (200 mg total) by mouth every 8 (eight) hours. 05/14/21  Yes Becky Augusta, NP  ipratropium (ATROVENT) 0.06 % nasal spray Place 2 sprays into both nostrils 4 (four) times daily. 05/14/21  Yes Becky Augusta, NP  promethazine-dextromethorphan (PROMETHAZINE-DM) 6.25-15 MG/5ML syrup Take 5 mLs by mouth 4 (four) times daily as needed. 05/14/21  Yes Becky Augusta, NP  etonogestrel-ethinyl estradiol (NUVARING)  0.12-0.015 MG/24HR vaginal ring INSERT VAGINALLY AND LEAVE IN PLACE FOR 3 CONSECUTIVE WEEKS, THEN REMOVE FOR 1 WEEK 11/28/20   Reubin Milan, MD  valACYclovir (VALTREX) 1000 MG tablet Take 1 tablet (1,000 mg total) by mouth daily. 09/14/19   Reubin Milan, MD    Family History Family History  Problem Relation Age of Onset   Migraines Mother    Hypertension Father     Social History Social History   Tobacco Use   Smoking status: Never   Smokeless tobacco: Never  Vaping Use   Vaping Use: Every day   Substances: Nicotine, Flavoring  Substance Use Topics   Alcohol use: Yes    Comment: rarely   Drug use: No     Allergies   Ciprofloxacin   Review of Systems Review of Systems  Constitutional:  Negative for activity change, appetite change and fever.  HENT:  Positive for sore throat. Negative for congestion, ear pain and rhinorrhea.   Respiratory:  Positive for cough. Negative for shortness of breath and wheezing.   Gastrointestinal:  Negative for diarrhea, nausea and vomiting.  Musculoskeletal:  Positive for arthralgias and myalgias.  Skin:  Negative for rash.  Neurological:  Positive for headaches.  Hematological: Negative.   Psychiatric/Behavioral: Negative.      Physical Exam Triage Vital Signs ED Triage Vitals  Enc Vitals Group     BP 05/14/21 1605 129/90     Pulse Rate 05/14/21 1605 94     Resp 05/14/21 1605 16  Temp 05/14/21 1605 98.9 F (37.2 C)     Temp Source 05/14/21 1605 Oral     SpO2 05/14/21 1605 100 %     Weight --      Height --      Head Circumference --      Peak Flow --      Pain Score 05/14/21 1604 5     Pain Loc --      Pain Edu? --      Excl. in GC? --    No data found.  Updated Vital Signs BP 129/90 (BP Location: Right Arm)    Pulse 94    Temp 98.9 F (37.2 C) (Oral)    Resp 16    LMP 04/13/2021 (Approximate)    SpO2 100%   Visual Acuity Right Eye Distance:   Left Eye Distance:   Bilateral Distance:    Right Eye Near:    Left Eye Near:    Bilateral Near:     Physical Exam Vitals and nursing note reviewed.  Constitutional:      Appearance: Normal appearance. She is not ill-appearing.  HENT:     Head: Normocephalic and atraumatic.     Right Ear: Tympanic membrane, ear canal and external ear normal. There is no impacted cerumen.     Left Ear: Tympanic membrane, ear canal and external ear normal. There is no impacted cerumen.     Nose: Congestion and rhinorrhea present.     Mouth/Throat:     Mouth: Mucous membranes are moist.     Pharynx: Oropharynx is clear. No posterior oropharyngeal erythema.  Cardiovascular:     Rate and Rhythm: Normal rate and regular rhythm.     Pulses: Normal pulses.     Heart sounds: Normal heart sounds. No murmur heard.   No friction rub. No gallop.  Pulmonary:     Effort: Pulmonary effort is normal.     Breath sounds: Normal breath sounds. No wheezing, rhonchi or rales.  Skin:    General: Skin is warm and dry.     Capillary Refill: Capillary refill takes less than 2 seconds.     Findings: No erythema or rash.  Neurological:     General: No focal deficit present.     Mental Status: She is alert and oriented to person, place, and time.  Psychiatric:        Mood and Affect: Mood normal.        Behavior: Behavior normal.        Thought Content: Thought content normal.        Judgment: Judgment normal.     UC Treatments / Results  Labs (all labs ordered are listed, but only abnormal results are displayed) Labs Reviewed  RAPID INFLUENZA A&B ANTIGENS  SARS CORONAVIRUS 2 (TAT 6-24 HRS)    EKG   Radiology No results found.  Procedures Procedures (including critical care time)  Medications Ordered in UC Medications - No data to display  Initial Impression / Assessment and Plan / UC Course  I have reviewed the triage vital signs and the nursing notes.  Pertinent labs & imaging results that were available during my care of the patient were reviewed by me and  considered in my medical decision making (see chart for details).  Patient is a nontoxic-appearing 46 year old female here for evaluation of respiratory complaints as outlined in the HPI above.  Patient symptoms started yesterday and she reports that she was in close proximity to another associate of hers  who was influenza positive.  Patient's physical exam reveals pearly gray tympanic membranes bilaterally with normal light reflex and clear external auditory canals.  Nasal mucosa is mildly erythematous and edematous with clear discharge in both nares.  Oropharyngeal exam is benign.  No cervical lymphadenopathy appreciated on exam.  Cardiopulmonary exam reveals clear lung sounds in all fields.  Patient exam is consistent with a upper respiratory infection, most likely viral.  Will swab patient for COVID and influenza.  Rapid influenza test is negative.  Will discharge patient home to isolate pending results of COVID test.  We will give Tessalon Perles and Promethazine DM cough syrup to help with cough as well as Atrovent nasal spray to help with nasal congestion.   Final Clinical Impressions(s) / UC Diagnoses   Final diagnoses:  Viral URI with cough     Discharge Instructions      Isolate at home pending the results of your COVID test.  If you test positive then you will have to quarantine for 5 days from the start of your symptoms.  After 5 days you can break quarantine if your symptoms have improved and you have not had a fever for 24 hours without taking Tylenol or ibuprofen.  Use over-the-counter Tylenol and ibuprofen as needed for body aches and fever.  Use the Atrovent nasal spray, 2 squirts in each nostril every 6 hours, as needed for runny nose and postnasal drip.  Use the Tessalon Perles every 8 hours during the day.  Take them with a small sip of water.  They may give you some numbness to the base of your tongue or a metallic taste in your mouth, this is normal.  Use the  Promethazine DM cough syrup at bedtime for cough and congestion.  It will make you drowsy so do not take it during the day.   If you develop any increased shortness of breath-especially at rest, you are unable to speak in full sentences, or is a late sign your lips are turning blue you need to go the ER for evaluation.      ED Prescriptions     Medication Sig Dispense Auth. Provider   benzonatate (TESSALON) 100 MG capsule Take 2 capsules (200 mg total) by mouth every 8 (eight) hours. 21 capsule Becky Augusta, NP   ipratropium (ATROVENT) 0.06 % nasal spray Place 2 sprays into both nostrils 4 (four) times daily. 15 mL Becky Augusta, NP   promethazine-dextromethorphan (PROMETHAZINE-DM) 6.25-15 MG/5ML syrup Take 5 mLs by mouth 4 (four) times daily as needed. 118 mL Becky Augusta, NP      PDMP not reviewed this encounter.   Becky Augusta, NP 05/14/21 1709

## 2021-05-15 LAB — SARS CORONAVIRUS 2 (TAT 6-24 HRS): SARS Coronavirus 2: NEGATIVE

## 2021-09-25 ENCOUNTER — Encounter: Payer: Self-pay | Admitting: Internal Medicine

## 2021-09-25 ENCOUNTER — Ambulatory Visit (INDEPENDENT_AMBULATORY_CARE_PROVIDER_SITE_OTHER): Payer: BC Managed Care – PPO | Admitting: Internal Medicine

## 2021-09-25 VITALS — BP 104/76 | HR 60 | Ht 65.5 in | Wt 130.0 lb

## 2021-09-25 DIAGNOSIS — Z1231 Encounter for screening mammogram for malignant neoplasm of breast: Secondary | ICD-10-CM

## 2021-09-25 DIAGNOSIS — E0590 Thyrotoxicosis, unspecified without thyrotoxic crisis or storm: Secondary | ICD-10-CM | POA: Diagnosis not present

## 2021-09-25 DIAGNOSIS — E782 Mixed hyperlipidemia: Secondary | ICD-10-CM | POA: Diagnosis not present

## 2021-09-25 DIAGNOSIS — Z0000 Encounter for general adult medical examination without abnormal findings: Secondary | ICD-10-CM

## 2021-09-25 DIAGNOSIS — K219 Gastro-esophageal reflux disease without esophagitis: Secondary | ICD-10-CM

## 2021-09-25 NOTE — Progress Notes (Signed)
Date:  09/25/2021   Name:  Candace Rangel   DOB:  1976-03-01   MRN:  [id number]   Chief Complaint: Annual Exam (Breast exam no pap ) Candace Rangel is a 46 y.o. female who presents today for her Complete Annual Exam. She feels well. She reports exercising walking at work. She reports she is sleeping well. Breast complaints none.  Mammogram: none DEXA: none Pap smear: 08/2019 neg with co-testing Colonoscopy: none  Health Maintenance Due  Topic Date Due   TETANUS/TDAP  Never done     There is no immunization history on file for this patient.  Thyroid Problem Presents for follow-up visit. Symptoms include palpitations (occasionally). Patient reports no anxiety, constipation, diarrhea, fatigue or tremors. The symptoms have been stable (normal labs in October except for elevated TPO ab).   Lab Results  Component Value Date   NA 141 09/19/2020   K 3.9 09/19/2020   CO2 20 09/19/2020   GLUCOSE 80 09/19/2020   BUN 9 09/19/2020   CREATININE 0.82 09/19/2020   CALCIUM 9.4 09/19/2020   EGFR 92 09/19/2020   GFRNONAA 99 09/14/2019   Lab Results  Component Value Date   CHOL 240 (H) 09/19/2020   HDL 69 09/19/2020   LDLCALC 148 (H) 09/19/2020   TRIG 130 09/19/2020   CHOLHDL 3.5 09/19/2020   Lab Results  Component Value Date   TSH 7.180 (H) 09/19/2020   No results found for: HGBA1C Lab Results  Component Value Date   WBC 8.8 09/19/2020   HGB 13.6 09/19/2020   HCT 40.4 09/19/2020   MCV 89 09/19/2020   PLT 336 09/19/2020   Lab Results  Component Value Date   ALT 8 09/19/2020   AST 15 09/19/2020   ALKPHOS 53 09/19/2020   BILITOT 0.5 09/19/2020   No results found for: 25OHVITD2, 25OHVITD3, VD25OH   Review of Systems  Constitutional:  Negative for chills, fatigue and fever.  HENT:  Negative for congestion, hearing loss, tinnitus, trouble swallowing and voice change.   Eyes:  Negative for visual disturbance.  Respiratory:  Negative for cough, chest  tightness, shortness of breath and wheezing.   Cardiovascular:  Positive for palpitations (occasionally). Negative for chest pain and leg swelling.  Gastrointestinal:  Negative for abdominal pain, constipation, diarrhea and vomiting.       Intermittent gerd   Endocrine: Negative for polydipsia and polyuria.  Genitourinary:  Negative for dysuria, frequency, genital sores, vaginal bleeding and vaginal discharge.  Musculoskeletal:  Negative for arthralgias, gait problem and joint swelling.  Skin:  Negative for color change and rash.  Neurological:  Negative for dizziness, tremors, light-headedness and headaches.  Hematological:  Negative for adenopathy. Does not bruise/bleed easily.  Psychiatric/Behavioral:  Negative for dysphoric mood and sleep disturbance. The patient is not nervous/anxious.    Patient Active Problem List   Diagnosis Date Noted   History of infection due to human papilloma virus (HPV) 08/02/2017   Hyperlipidemia, mixed 05/31/2015   Recurrent genital herpes 05/31/2015   GERD (gastroesophageal reflux disease) 05/31/2015   Migraine without aura and without status migrainosus, not intractable 05/31/2015    Allergies  Allergen Reactions   Ciprofloxacin Other (See Comments)    headache    Past Surgical History:  Procedure Laterality Date   NO PAST SURGERIES      Social History   Tobacco Use   Smoking status: Never   Smokeless tobacco: Never  Vaping Use   Vaping Use: Every day   Substances: Nicotine,  Flavoring  Substance Use Topics   Alcohol use: Yes    Comment: rarely   Drug use: No     Medication list has been reviewed and updated.  Current Meds  Medication Sig   valACYclovir (VALTREX) 1000 MG tablet Take 1 tablet (1,000 mg total) by mouth daily.   [DISCONTINUED] ipratropium (ATROVENT) 0.06 % nasal spray Place 2 sprays into both nostrils 4 (four) times daily.       09/19/2020    9:05 AM 10/14/2019   10:17 AM 09/14/2019    9:38 AM  GAD 7 :  Generalized Anxiety Score  Nervous, Anxious, on Edge 3 0 0  Control/stop worrying 1 0 0  Worry too much - different things 1 0 0  Trouble relaxing 0 0 0  Restless 0 0 0  Easily annoyed or irritable 0 0 0  Afraid - awful might happen 0 0 0  Total GAD 7 Score 5 0 0  Anxiety Difficulty  Not difficult at all Not difficult at all       09/19/2020    9:05 AM  Depression screen PHQ 2/9  Decreased Interest 0  Down, Depressed, Hopeless 0  PHQ - 2 Score 0  Altered sleeping 0  Tired, decreased energy 0  Change in appetite 0  Feeling bad or failure about yourself  0  Trouble concentrating 0  Moving slowly or fidgety/restless 0  Suicidal thoughts 0  PHQ-9 Score 0    BP Readings from Last 3 Encounters:  09/25/21 104/76  05/14/21 129/90  01/01/21 (!) 125/95    Physical Exam Vitals and nursing note reviewed.  Constitutional:      General: She is not in acute distress.    Appearance: She is well-developed.  HENT:     Head: Normocephalic and atraumatic.     Right Ear: Tympanic membrane and ear canal normal.     Left Ear: Tympanic membrane and ear canal normal.     Nose:     Right Sinus: No maxillary sinus tenderness.     Left Sinus: No maxillary sinus tenderness.  Eyes:     General: No scleral icterus.       Right eye: No discharge.        Left eye: No discharge.     Conjunctiva/sclera: Conjunctivae normal.  Neck:     Thyroid: No thyromegaly.     Vascular: No carotid bruit.  Cardiovascular:     Rate and Rhythm: Normal rate and regular rhythm.     Pulses: Normal pulses.     Heart sounds: Normal heart sounds.  Pulmonary:     Effort: Pulmonary effort is normal. No respiratory distress.     Breath sounds: No wheezing.  Chest:  Breasts:    Right: No mass, nipple discharge, skin change or tenderness.     Left: No mass, nipple discharge, skin change or tenderness.  Abdominal:     General: Bowel sounds are normal.     Palpations: Abdomen is soft.     Tenderness: There is no  abdominal tenderness.  Musculoskeletal:     Cervical back: Normal range of motion. No erythema.     Right lower leg: No edema.     Left lower leg: No edema.  Lymphadenopathy:     Cervical: No cervical adenopathy.  Skin:    General: Skin is warm and dry.     Findings: No rash.  Neurological:     Mental Status: She is alert and oriented to person, place, and  time.     Cranial Nerves: No cranial nerve deficit.     Sensory: No sensory deficit.     Deep Tendon Reflexes: Reflexes are normal and symmetric.  Psychiatric:        Attention and Perception: Attention normal.        Mood and Affect: Mood normal.    Wt Readings from Last 3 Encounters:  09/25/21 130 lb (59 kg)  09/19/20 133 lb (60.3 kg)  12/13/19 132 lb (59.9 kg)    BP 104/76   Pulse 60   Ht 5' 5.5" (1.664 m)   Wt 130 lb (59 kg)   SpO2 98%   BMI 21.30 kg/m   Assessment and Plan: 1. Annual physical exam Normal exam. Continue healthy diet, exercise. - Comprehensive metabolic panel  2. Encounter for screening mammogram for breast cancer - MM 3D SCREEN BREAST BILATERAL  3. Hyperlipidemia, mixed - Lipid panel  4. Gastroesophageal reflux disease, unspecified whether esophagitis present Symptoms well controlled on PRN tums No red flag signs such as weight loss, n/v, melena - CBC with Differential/Platelet  5. Hyperthyroidism Seen by Endo but high co-pay so did not follow up. Symptoms stable; will repeat labs and advise - TSH+T4F+T3Free - Thyroid peroxidase antibody   Partially dictated using Editor, commissioning. Any errors are unintentional.  Halina Maidens, MD [facility]  09/25/2021

## 2021-09-26 ENCOUNTER — Encounter: Payer: Self-pay | Admitting: Internal Medicine

## 2021-09-26 LAB — TSH+T4F+T3FREE
Free T4: 0.96 ng/dL (ref 0.82–1.77)
T3, Free: 2.8 pg/mL (ref 2.0–4.4)
TSH: 3.48 u[IU]/mL (ref 0.450–4.500)

## 2021-09-26 LAB — COMPREHENSIVE METABOLIC PANEL
ALT: 8 IU/L (ref 0–32)
AST: 13 IU/L (ref 0–40)
Albumin/Globulin Ratio: 1.9 (ref 1.2–2.2)
Albumin: 4.3 g/dL (ref 3.8–4.8)
Alkaline Phosphatase: 58 IU/L (ref 44–121)
BUN/Creatinine Ratio: 12 (ref 9–23)
BUN: 8 mg/dL (ref 6–24)
Bilirubin Total: 0.8 mg/dL (ref 0.0–1.2)
CO2: 20 mmol/L (ref 20–29)
Calcium: 9.2 mg/dL (ref 8.7–10.2)
Chloride: 103 mmol/L (ref 96–106)
Creatinine, Ser: 0.69 mg/dL (ref 0.57–1.00)
Globulin, Total: 2.3 g/dL (ref 1.5–4.5)
Glucose: 76 mg/dL (ref 70–99)
Potassium: 4.6 mmol/L (ref 3.5–5.2)
Sodium: 140 mmol/L (ref 134–144)
Total Protein: 6.6 g/dL (ref 6.0–8.5)
eGFR: 110 mL/min/{1.73_m2} (ref >59)

## 2021-09-26 LAB — LIPID PANEL
Chol/HDL Ratio: 3 ratio (ref 0.0–4.4)
Cholesterol, Total: 213 mg/dL — ABNORMAL HIGH (ref 100–199)
HDL: 71 mg/dL (ref >39)
LDL Chol Calc (NIH): 132 mg/dL — ABNORMAL HIGH (ref 0–99)
Triglycerides: 55 mg/dL (ref 0–149)
VLDL Cholesterol Cal: 10 mg/dL (ref 5–40)

## 2021-09-26 LAB — CBC WITH DIFFERENTIAL/PLATELET
Basophils Absolute: 0.1 10*3/uL (ref 0.0–0.2)
Basos: 1 %
EOS (ABSOLUTE): 0.1 10*3/uL (ref 0.0–0.4)
Eos: 3 %
Hematocrit: 37.1 % (ref 34.0–46.6)
Hemoglobin: 12.7 g/dL (ref 11.1–15.9)
Immature Grans (Abs): 0 10*3/uL (ref 0.0–0.1)
Immature Granulocytes: 0 %
Lymphocytes Absolute: 2.1 10*3/uL (ref 0.7–3.1)
Lymphs: 41 %
MCH: 29.6 pg (ref 26.6–33.0)
MCHC: 34.2 g/dL (ref 31.5–35.7)
MCV: 87 fL (ref 79–97)
Monocytes Absolute: 0.6 10*3/uL (ref 0.1–0.9)
Monocytes: 12 %
Neutrophils Absolute: 2.2 10*3/uL (ref 1.4–7.0)
Neutrophils: 43 %
Platelets: 326 10*3/uL (ref 150–450)
RBC: 4.29 x10E6/uL (ref 3.77–5.28)
RDW: 13.2 % (ref 11.7–15.4)
WBC: 5.1 10*3/uL (ref 3.4–10.8)

## 2021-09-26 LAB — THYROID PEROXIDASE ANTIBODY: Thyroperoxidase Ab SerPl-aCnc: 174 IU/mL — ABNORMAL HIGH (ref 0–34)

## 2022-01-30 ENCOUNTER — Ambulatory Visit: Payer: BC Managed Care – PPO | Admitting: Internal Medicine

## 2022-01-30 ENCOUNTER — Encounter: Payer: Self-pay | Admitting: Internal Medicine

## 2022-01-30 VITALS — BP 94/76 | HR 92 | Ht 65.0 in | Wt 125.0 lb

## 2022-01-30 DIAGNOSIS — A6000 Herpesviral infection of urogenital system, unspecified: Secondary | ICD-10-CM

## 2022-01-30 DIAGNOSIS — N946 Dysmenorrhea, unspecified: Secondary | ICD-10-CM | POA: Diagnosis not present

## 2022-01-30 MED ORDER — IBUPROFEN 800 MG PO TABS: 800.0000 mg | ORAL_TABLET | Freq: Three times a day (TID) | ORAL | 0 refills | Status: DC | PRN

## 2022-01-30 MED ORDER — VALACYCLOVIR HCL 1 G PO TABS: 1000.0000 mg | ORAL_TABLET | Freq: Every day | ORAL | 12 refills | Status: AC

## 2022-01-30 NOTE — Progress Notes (Deleted)
     Annual Physical Exam Visit  Patient Information:  Patient ID: Candace Rangel, female DOB: 16-Aug-1975 Age: 46 y.o. MRN: [id number]   Subjective:   CC: Annual Physical Exam  HPI:  Candace Rangel is here for their annual physical.  I reviewed the past medical history, family history, social history, surgical history, and allergies today and changes were made as necessary.  Please see the problem list section below for additional details.  Past Medical History: Past Medical History:  Diagnosis Date   Sprain of anterior cruciate ligament of left knee 05/22/2016   03/2016 sledding accident   Past Surgical History: Past Surgical History:  Procedure Laterality Date   NO PAST SURGERIES     Family History: Family History  Problem Relation Age of Onset   Migraines Mother    Hypertension Father    Allergies: Allergies  Allergen Reactions   Ciprofloxacin Other (See Comments)    headache   Health Maintenance: Health Maintenance  Topic Date Due   TETANUS/TDAP  Never done   INFLUENZA VACCINE  Never done   HIV Screening  07/31/2022 (Originally 10/15/1992)   PAP SMEAR-Modifier  09/13/2024   Hepatitis C Screening  Completed   Pneumococcal Vaccine 53-80 Years old  Aged Out   HPV VACCINES  Aged Out    HM Colonoscopy     This patient has no relevant Health Maintenance data.      Medications: Current Outpatient Medications on File Prior to Visit  Medication Sig Dispense Refill   valACYclovir (VALTREX) 1000 MG tablet Take 1 tablet (1,000 mg total) by mouth daily. 30 tablet 12   No current facility-administered medications on file prior to visit.    Review of Systems: No headache, visual changes, nausea, vomiting, diarrhea, constipation, dizziness, abdominal pain, skin rash, fevers, chills, night sweats, swollen lymph nodes, weight loss, chest pain, body aches, joint swelling, muscle aches, shortness of breath, mood changes, visual or auditory  hallucinations reported.  Objective:  There were no vitals filed for this visit. There were no vitals filed for this visit. There is no height or weight on file to calculate BMI.  General: Well Developed, well nourished, and in no acute distress.  Neuro: Alert and oriented x3, extra-ocular muscles intact, sensation grossly intact. Cranial nerves II through XII are grossly intact, motor, sensory, and coordinative functions are intact. HEENT: Normocephalic, atraumatic, pupils equal round reactive to light, neck supple, no masses, no lymphadenopathy, thyroid nonpalpable. Oropharynx, nasopharynx, external ear canals are unremarkable. Skin: Warm and dry, no rashes noted.  Cardiac: Regular rate and rhythm, no murmurs rubs or gallops. No peripheral edema. Pulses symmetric. Respiratory: Clear to auscultation bilaterally. Not using accessory muscles, speaking in full sentences.  Abdominal: Soft, nontender, nondistended, positive bowel sounds, no masses, no organomegaly. Musculoskeletal: Shoulder, elbow, wrist, hip, knee, ankle stable, and with full range of motion.  Female chaperone initials: *** present throughout the physical examination.  Impression and Recommendations:   The patient was counselled, risk factors were discussed, and anticipatory guidance given.  Problem List Items Addressed This Visit   None    Orders & Medications Medications: No orders of the defined types were placed in this encounter.  No orders of the defined types were placed in this encounter.    No follow-ups on file.    Ival Bible Hans Rusher, [place]   [facility] [place]

## 2022-01-30 NOTE — Progress Notes (Signed)
Date:  01/30/2022   Name:  Candace Rangel   DOB:  01/27/1976   MRN:  [id number]   Chief Complaint: Abdominal Cramping (Started in August)  Abdominal Cramping This is a new problem. Episode onset: 2 months. The onset quality is sudden. The problem occurs constantly. The problem has been unchanged. The pain is located in the LLQ and RLQ. The pain is at a severity of 6/10. The pain is mild. The quality of the pain is cramping and dull. The abdominal pain does not radiate. Pertinent negatives include no constipation, diarrhea, fever, hematuria or vomiting. The pain is relieved by Nothing. Treatments tried: heat, otc cramping med. The treatment provided no relief.    Lab Results  Component Value Date   NA 140 09/25/2021   K 4.6 09/25/2021   CO2 20 09/25/2021   GLUCOSE 76 09/25/2021   BUN 8 09/25/2021   CREATININE 0.69 09/25/2021   CALCIUM 9.2 09/25/2021   EGFR 110 09/25/2021   GFRNONAA 99 09/14/2019   Lab Results  Component Value Date   CHOL 213 (H) 09/25/2021   HDL 71 09/25/2021   LDLCALC 132 (H) 09/25/2021   TRIG 55 09/25/2021   CHOLHDL 3.0 09/25/2021   Lab Results  Component Value Date   TSH 3.480 09/25/2021   No results found for: "HGBA1C" Lab Results  Component Value Date   WBC 5.1 09/25/2021   HGB 12.7 09/25/2021   HCT 37.1 09/25/2021   MCV 87 09/25/2021   PLT 326 09/25/2021   Lab Results  Component Value Date   ALT 8 09/25/2021   AST 13 09/25/2021   ALKPHOS 58 09/25/2021   BILITOT 0.8 09/25/2021   No results found for: "25OHVITD2", "25OHVITD3", "VD25OH"   Review of Systems  Constitutional:  Positive for diaphoresis (hot flashes occasionally). Negative for chills, fever and unexpected weight change.  Respiratory:  Negative for chest tightness and shortness of breath.   Cardiovascular:  Negative for chest pain.  Gastrointestinal:  Positive for abdominal pain. Negative for constipation, diarrhea and vomiting.  Genitourinary:  Positive for  menstrual problem (severe cramping). Negative for genital sores and hematuria.    Patient Active Problem List   Diagnosis Date Noted   History of infection due to human papilloma virus (HPV) 08/02/2017   Hyperlipidemia, mixed 05/31/2015   Recurrent genital herpes 05/31/2015   GERD (gastroesophageal reflux disease) 05/31/2015   Migraine without aura and without status migrainosus, not intractable 05/31/2015    Allergies  Allergen Reactions   Ciprofloxacin Other (See Comments)    headache    Past Surgical History:  Procedure Laterality Date   NO PAST SURGERIES      Social History   Tobacco Use   Smoking status: Never   Smokeless tobacco: Never  Vaping Use   Vaping Use: Every day   Substances: Nicotine, Flavoring  Substance Use Topics   Alcohol use: Yes    Comment: rarely   Drug use: No     Medication list has been reviewed and updated.  Current Meds  Medication Sig   ibuprofen (ADVIL) 800 MG tablet Take 1 tablet (800 mg total) by mouth every 8 (eight) hours as needed.   [DISCONTINUED] valACYclovir (VALTREX) 1000 MG tablet Take 1 tablet (1,000 mg total) by mouth daily.       01/30/2022    3:40 PM 09/25/2021    8:48 AM 09/19/2020    9:05 AM 10/14/2019   10:17 AM  GAD 7 : Generalized Anxiety Score  Nervous,  Anxious, on Edge _0 0  Control/stop worrying _1 0  Worry too much - different things _2 0  Trouble relaxing 0 0 0 0  Restless 0 0 0 0  Easily annoyed or irritable 1 0 0 0  Afraid - awful might happen 1 1 0 0  Total GAD 7 Score _3 0  Anxiety Difficulty Not difficult at all Not difficult at all  Not difficult at all       01/30/2022    3:40 PM 09/25/2021    8:48 AM 09/19/2020    9:05 AM  Depression screen PHQ 2/9  Decreased Interest 0 0 0  Down, Depressed, Hopeless 0 0 0  PHQ - 2 Score 0 0 0  Altered sleeping 1 0 0  Tired, decreased energy 1 1 0  Change in appetite 0 1 0  Feeling bad or failure about yourself  0 1 0  Trouble concentrating  0 0 0  Moving slowly or fidgety/restless 0 0 0  Suicidal thoughts 0 0 0  PHQ-9 Score 2 3 0  Difficult doing work/chores Not difficult at all Not difficult at all     BP Readings from Last 3 Encounters:  01/30/22 94/76  09/25/21 104/76  05/14/21 129/90    Physical Exam Vitals and nursing note reviewed.  Constitutional:      General: She is not in acute distress.    Appearance: Normal appearance. She is well-developed.  HENT:     Head: Normocephalic and atraumatic.  Cardiovascular:     Rate and Rhythm: Normal rate and regular rhythm.  Pulmonary:     Effort: Pulmonary effort is normal. No respiratory distress.     Breath sounds: No wheezing or rhonchi.  Abdominal:     General: Abdomen is flat. There is no distension.     Palpations: Abdomen is soft. There is no mass.     Tenderness: There is no abdominal tenderness.  Skin:    General: Skin is warm and dry.     Findings: No rash.  Neurological:     Mental Status: She is alert and oriented to person, place, and time.  Psychiatric:        Mood and Affect: Mood normal.        Behavior: Behavior normal.     Wt Readings from Last 3 Encounters:  01/30/22 125 lb (56.7 kg)  09/25/21 130 lb (59 kg)  09/19/20 133 lb (60.3 kg)    BP 94/76   Pulse 92   Ht _4  (1.651 m)   Wt 125 lb (56.7 kg)   LMP 01/28/2022   SpO2 97%   BMI 20.80 kg/m   Assessment and Plan: 1. Dysmenorrhea, unspecified With the hot flashes and increased pain with menstrual cycle could be fibroid, perimenopausal, etc Recommend Advil 800 mg tid starting before menses begin Will get [place] to further evaluate - ibuprofen (ADVIL) 800 MG tablet; Take 1 tablet (800 mg total) by mouth every 8 (eight) hours as needed.  Dispense: 60 tablet; Refill: 0 - US Pelvic Complete With Transvaginal  2. Recurrent genital herpes - valACYclovir (VALTREX) 1000 MG tablet; Take 1 tablet (1,000 mg total) by mouth daily.  Dispense: 30 tablet; Refill: 12   Partially dictated  using Editor, commissioning. Any errors are unintentional.  Halina Maidens, MD [facility]  01/30/2022

## 2022-02-01 ENCOUNTER — Other Ambulatory Visit: Payer: Self-pay | Admitting: Internal Medicine

## 2022-02-01 ENCOUNTER — Ambulatory Visit
Admission: RE | Admit: 2022-02-01 | Discharge: 2022-02-01 | Disposition: A | Discharge status: Home / Self Care | Payer: BC Managed Care – PPO | Source: Ambulatory Visit | Attending: Internal Medicine | Admitting: Internal Medicine

## 2022-02-01 DIAGNOSIS — N946 Dysmenorrhea, unspecified: Secondary | ICD-10-CM

## 2022-02-01 DIAGNOSIS — N854 Malposition of uterus: Secondary | ICD-10-CM | POA: Diagnosis not present

## 2022-02-01 MED ORDER — NORETHINDRONE ACET-ETHINYL EST 1-20 MG-MCG PO TABS: 1.0000 | ORAL_TABLET | Freq: Every day | ORAL | 3 refills | Status: DC

## 2022-02-01 NOTE — Progress Notes (Signed)
Appt scheduled.  KP 

## 2022-02-01 NOTE — Progress Notes (Signed)
Pt informed and verbalized understanding.  Pt wants OCP sent to [facility] in [place].  KP

## 2022-05-18 ENCOUNTER — Other Ambulatory Visit: Payer: Self-pay | Admitting: Internal Medicine

## 2022-05-18 DIAGNOSIS — N946 Dysmenorrhea, unspecified: Secondary | ICD-10-CM

## 2022-06-05 ENCOUNTER — Ambulatory Visit: Payer: Managed Care, Other (non HMO) | Admitting: Internal Medicine

## 2022-06-05 NOTE — Progress Notes (Deleted)
Date:  06/05/2022   Name:  Candace Rangel   DOB:  26-Jul-1975   MRN:  [id number]   Chief Complaint: No chief complaint on file.  HPI Dysmenorrhea - Recent pelvic pain and menstrual irregularity.  [place] was normal.  HRT/OCP started in October.  Lab Results  Component Value Date   NA 140 09/25/2021   K 4.6 09/25/2021   CO2 20 09/25/2021   GLUCOSE 76 09/25/2021   BUN 8 09/25/2021   CREATININE 0.69 09/25/2021   CALCIUM 9.2 09/25/2021   EGFR 110 09/25/2021   GFRNONAA 99 09/14/2019   Lab Results  Component Value Date   CHOL 213 (H) 09/25/2021   HDL 71 09/25/2021   LDLCALC 132 (H) 09/25/2021   TRIG 55 09/25/2021   CHOLHDL 3.0 09/25/2021   Lab Results  Component Value Date   TSH 3.480 09/25/2021   No results found for: "HGBA1C" Lab Results  Component Value Date   WBC 5.1 09/25/2021   HGB 12.7 09/25/2021   HCT 37.1 09/25/2021   MCV 87 09/25/2021   PLT 326 09/25/2021   Lab Results  Component Value Date   ALT 8 09/25/2021   AST 13 09/25/2021   ALKPHOS 58 09/25/2021   BILITOT 0.8 09/25/2021   No results found for: "25OHVITD2", "25OHVITD3", "VD25OH"   Review of Systems  Constitutional:  Negative for fatigue and unexpected weight change.  HENT:  Negative for nosebleeds.   Eyes:  Negative for visual disturbance.  Respiratory:  Negative for cough, chest tightness, shortness of breath and wheezing.   Cardiovascular:  Negative for chest pain, palpitations and leg swelling.  Gastrointestinal:  Negative for abdominal pain, constipation and diarrhea.  Neurological:  Negative for dizziness, weakness, light-headedness and headaches.    Patient Active Problem List   Diagnosis Date Noted   Dysmenorrhea, unspecified 01/30/2022   History of infection due to human papilloma virus (HPV) 08/02/2017   Hyperlipidemia, mixed 05/31/2015   Recurrent genital herpes 05/31/2015   GERD (gastroesophageal reflux disease) 05/31/2015   Migraine without aura and without status  migrainosus, not intractable 05/31/2015    Allergies  Allergen Reactions   Ciprofloxacin Other (See Comments)    headache    Past Surgical History:  Procedure Laterality Date   NO PAST SURGERIES      Social History   Tobacco Use   Smoking status: Never   Smokeless tobacco: Never  Vaping Use   Vaping Use: Every day   Substances: Nicotine, Flavoring  Substance Use Topics   Alcohol use: Yes    Comment: rarely   Drug use: No     Medication list has been reviewed and updated.  No outpatient medications have been marked as taking for the 06/05/22 encounter (Appointment) with Glean Hess, MD.       01/30/2022    3:40 PM 09/25/2021    8:48 AM 09/19/2020    9:05 AM 10/14/2019   10:17 AM  GAD 7 : Generalized Anxiety Score  Nervous, Anxious, on Edge 1 1 3 $ 0  Control/stop worrying 1 1 1 $ 0  Worry too much - different things 1 1 1 $ 0  Trouble relaxing 0 0 0 0  Restless 0 0 0 0  Easily annoyed or irritable 1 0 0 0  Afraid - awful might happen 1 1 0 0  Total GAD 7 Score 5 4 5 $ 0  Anxiety Difficulty Not difficult at all Not difficult at all  Not difficult at all  01/30/2022    3:40 PM 09/25/2021    8:48 AM 09/19/2020    9:05 AM  Depression screen PHQ 2/9  Decreased Interest 0 0 0  Down, Depressed, Hopeless 0 0 0  PHQ - 2 Score 0 0 0  Altered sleeping 1 0 0  Tired, decreased energy 1 1 0  Change in appetite 0 1 0  Feeling bad or failure about yourself  0 1 0  Trouble concentrating 0 0 0  Moving slowly or fidgety/restless 0 0 0  Suicidal thoughts 0 0 0  PHQ-9 Score 2 3 0  Difficult doing work/chores Not difficult at all Not difficult at all     BP Readings from Last 3 Encounters:  01/30/22 94/76  09/25/21 104/76  05/14/21 129/90    Physical Exam Vitals and nursing note reviewed.  Constitutional:      General: She is not in acute distress.    Appearance: She is well-developed.  HENT:     Head: Normocephalic and atraumatic.  Pulmonary:     Effort:  Pulmonary effort is normal. No respiratory distress.  Skin:    General: Skin is warm and dry.     Findings: No rash.  Neurological:     Mental Status: She is alert and oriented to person, place, and time.  Psychiatric:        Mood and Affect: Mood normal.        Behavior: Behavior normal.     Wt Readings from Last 3 Encounters:  01/30/22 125 lb (56.7 kg)  09/25/21 130 lb (59 kg)  09/19/20 133 lb (60.3 kg)    There were no vitals taken for this visit.  Assessment and Plan: Problem List Items Addressed This Visit   None Visit Diagnoses     Hormone replacement therapy (HRT)    -  Primary        Partially dictated using Editor, commissioning. Any errors are unintentional.  Halina Maidens, MD [facility]  06/05/2022

## 2023-03-26 ENCOUNTER — Ambulatory Visit
Admission: EM | Admit: 2023-03-26 | Discharge: 2023-03-26 | Disposition: A | Discharge status: Home / Self Care | Payer: BC Managed Care – PPO | Attending: Emergency Medicine | Admitting: Emergency Medicine

## 2023-03-26 DIAGNOSIS — M62830 Muscle spasm of back: Secondary | ICD-10-CM | POA: Diagnosis not present

## 2023-03-26 MED ORDER — TIZANIDINE HCL 4 MG PO TABS: 4.0000 mg | ORAL_TABLET | Freq: Three times a day (TID) | ORAL | 0 refills | Status: AC | PRN

## 2023-03-26 MED ORDER — NAPROXEN 500 MG PO TABS: 500.0000 mg | ORAL_TABLET | Freq: Two times a day (BID) | ORAL | 0 refills | Status: AC

## 2023-03-26 MED ORDER — METAXALONE 800 MG PO TABS: 800.0000 mg | ORAL_TABLET | Freq: Three times a day (TID) | ORAL | 0 refills | Status: AC

## 2023-03-26 MED ORDER — PREDNISONE 20 MG PO TABS: 40.0000 mg | ORAL_TABLET | Freq: Every day | ORAL | 0 refills | Status: AC

## 2023-03-26 NOTE — Discharge Instructions (Signed)
Take the Naprosyn with 500 to 1000 mg of Tylenol twice a day.  The Skelaxin is a muscle relaxant, and is very effective.  However, if it is too expensive, you can fill the Zanaflex.  It is also effective but has a few more side effects.  Finish the prednisone, even if you feel better. Continue heat and gentle stretching. [employer] in [place] on [address] is excellent for deep tissue massage.

## 2023-03-26 NOTE — ED Triage Notes (Signed)
Pt presents to UC c/o RT shoulder blade pain onset Sunday morning.

## 2023-03-26 NOTE — ED Provider Notes (Signed)
HPI  SUBJECTIVE:  Candace Rangel is a 47 y.o. female who presents with 3 days of constant muscle spasm/tightness, sharp pain immediately lateral to her right scapula.  States she woke up with it.  It does not migrate, radiate.  She denies coughing, wheezing, shortness of breath, fevers, change in physical activity, trauma to the area.  No shoulder pain.  She states it was better yesterday until she started scraping snow, which aggravated it.  She has tried ibuprofen 800 mg, heat, Flexeril, self massage and Biofreeze.  Ibuprofen and heat help.  Symptoms are worse with abduction, bending forward, torso rotation.  She has never had symptoms like this before.  No calf pain, swelling, surgery in the past 4 weeks, hemoptysis, recent immobilization, exogenous estrogen.  No history of PE, DVT, cancer.  Denies OCP use.  PCP: [facility].    Past Medical History:  Diagnosis Date   Sprain of anterior cruciate ligament of left knee 05/22/2016   03/2016 sledding accident    Past Surgical History:  Procedure Laterality Date   NO PAST SURGERIES      Family History  Problem Relation Age of Onset   Migraines Mother    Hypertension Father     Social History   Tobacco Use   Smoking status: Never   Smokeless tobacco: Never  Vaping Use   Vaping status: Every Day   Substances: Nicotine, Flavoring  Substance Use Topics   Alcohol use: Yes    Comment: rarely   Drug use: No    No current facility-administered medications for this encounter.  Current Outpatient Medications:    metaxalone (SKELAXIN) 800 MG tablet, Take 1 tablet (800 mg total) by mouth 3 (three) times daily. Take on an empty stomach, Disp: 30 tablet, Rfl: 0   naproxen (NAPROSYN) 500 MG tablet, Take 1 tablet (500 mg total) by mouth 2 (two) times daily., Disp: 20 tablet, Rfl: 0   predniSONE (DELTASONE) 20 MG tablet, Take 2 tablets (40 mg total) by mouth daily with breakfast for 5 days., Disp: 10 tablet, Rfl: 0    tiZANidine (ZANAFLEX) 4 MG tablet, Take 1 tablet (4 mg total) by mouth every 8 (eight) hours as needed for muscle spasms., Disp: 30 tablet, Rfl: 0   JUNEL 1/20 1-20 MG-MCG tablet, TAKE 1 TABLET BY MOUTH EVERY DAY, Disp: 21 tablet, Rfl: 3   valACYclovir (VALTREX) 1000 MG tablet, Take 1 tablet (1,000 mg total) by mouth daily., Disp: 30 tablet, Rfl: 12  Allergies  Allergen Reactions   Ciprofloxacin Other (See Comments)    headache     ROS  As noted in HPI.   Physical Exam  BP (!) 135/97 (BP Location: Left Arm)   Pulse 79   Temp 98.9 F (37.2 C) (Oral)   SpO2 100%   Constitutional: Well developed, well nourished, no acute distress Eyes:  EOMI, conjunctiva normal bilaterally HENT: Normocephalic, atraumatic,mucus membranes moist Respiratory: Normal inspiratory effort Cardiovascular: Normal rate GI: nondistended skin: No rash, skin intact Musculoskeletal: Calves symmetric, nontender, no edema.   No C-spine, T-spine tenderness.  Positive muscle spasm, tender knot medial to the right scapula at the rhomboid. R shoulder with ROM normal , Drop test normal,   clavicle NT , A/C joint NT, scapula NT, proximal humerus NT, trapezius  tender, shoulder joint NT, Motor strength normal , Sensation intact LT over deltoid region, distal NVI with hand having intact sensation and strength in the median, radial, and ulnar nerve distribution.   negative tenderness in  bicipital groove, pain aggravated with empty can and liftoff test.  RP 2+  Neurologic: Alert & oriented x 3, no focal neuro deficits Psychiatric: Speech and behavior appropriate   ED Course   Medications - No data to display  No orders of the defined types were placed in this encounter.   No results found for this or any previous visit (from the past 24 hour(s)). No results found.  ED Clinical Impression  1. Muscle spasm of back      ED Assessment/Plan     Patient PERC negative.  Doubt PE.  Denies OCP use.  This appears to  be very musculoskeletal.  Pain is aggravated with torso rotation, arm movement.  She has a tender knot along the rhomboid.  Doubt rotator cuff injury in the absence of repetitive use or trauma.  Do not think imaging would be helpful today as I doubt fracture, dislocation, pneumothorax, pneumonia.  Denies chest pain.  will send home with Naprosyn/Tylenol, Skelaxin, prednisone 40 mg for 5 days, advised deep tissue massage.  Prescription for Zanaflex if the Skelaxin is too expensive.  Work note for today and tomorrow.  Follow-up with PCP as needed.  Discussed MDM, treatment plan, and plan for follow-up with patient.patient agrees with plan.   Meds ordered this encounter  Medications   naproxen (NAPROSYN) 500 MG tablet    Sig: Take 1 tablet (500 mg total) by mouth 2 (two) times daily.    Dispense:  20 tablet    Refill:  0   metaxalone (SKELAXIN) 800 MG tablet    Sig: Take 1 tablet (800 mg total) by mouth 3 (three) times daily. Take on an empty stomach    Dispense:  30 tablet    Refill:  0   predniSONE (DELTASONE) 20 MG tablet    Sig: Take 2 tablets (40 mg total) by mouth daily with breakfast for 5 days.    Dispense:  10 tablet    Refill:  0   tiZANidine (ZANAFLEX) 4 MG tablet    Sig: Take 1 tablet (4 mg total) by mouth every 8 (eight) hours as needed for muscle spasms.    Dispense:  30 tablet    Refill:  0      *This clinic note was created using Scientist, clinical (histocompatibility and immunogenetics). Therefore, there may be occasional mistakes despite careful proofreading.  ?    Domenick Gong, MD 03/26/23 [phone]

## 2023-05-13 ENCOUNTER — Encounter: Payer: Self-pay | Admitting: Emergency Medicine

## 2023-05-13 ENCOUNTER — Ambulatory Visit
Admission: EM | Admit: 2023-05-13 | Discharge: 2023-05-13 | Disposition: A | Discharge status: Home / Self Care | Payer: BC Managed Care – PPO | Attending: Emergency Medicine | Admitting: Emergency Medicine

## 2023-05-13 DIAGNOSIS — J09X2 Influenza due to identified novel influenza A virus with other respiratory manifestations: Secondary | ICD-10-CM | POA: Diagnosis not present

## 2023-05-13 LAB — RESP PANEL BY RT-PCR (FLU A&B, COVID) ARPGX2
Influenza A by PCR: POSITIVE — AB
Influenza B by PCR: NEGATIVE
SARS Coronavirus 2 by RT PCR: NEGATIVE

## 2023-05-13 MED ORDER — BENZONATATE 100 MG PO CAPS: 200.0000 mg | ORAL_CAPSULE | Freq: Three times a day (TID) | ORAL | 0 refills | Status: AC

## 2023-05-13 MED ORDER — PROMETHAZINE-DM 6.25-15 MG/5ML PO SYRP: 5.0000 mL | ORAL_SOLUTION | Freq: Four times a day (QID) | ORAL | 0 refills | Status: AC | PRN

## 2023-05-13 NOTE — ED Provider Notes (Signed)
[facility]    CSN: [id number] Arrival date & time: 05/13/23  0844      History   Chief Complaint Chief Complaint  Patient presents with   Cough    HPI Candace Rangel is a 48 y.o. female.   HPI  48 year old female with past medical history significant for mixed hyperlipidemia, migraine headaches, and GERD presents for evaluation of 3 days worth of nonproductive cough with associated shortness of breath and wheezing.  She denies any fever or URI symptoms.  She reports that her son was recently diagnosed with influenza but she has not had any of his shared symptoms.  Past Medical History:  Diagnosis Date   Sprain of anterior cruciate ligament of left knee 05/22/2016   03/2016 sledding accident    Patient Active Problem List   Diagnosis Date Noted   Dysmenorrhea, unspecified 01/30/2022   History of infection due to human papilloma virus (HPV) 08/02/2017   Hyperlipidemia, mixed 05/31/2015   Recurrent genital herpes 05/31/2015   GERD (gastroesophageal reflux disease) 05/31/2015   Migraine without aura and without status migrainosus, not intractable 05/31/2015    Past Surgical History:  Procedure Laterality Date   NO PAST SURGERIES      OB History   No obstetric history on file.      Home Medications    Prior to Admission medications   Medication Sig Start Date End Date Taking? Authorizing Provider  benzonatate (TESSALON) 100 MG capsule Take 2 capsules (200 mg total) by mouth every 8 (eight) hours. 05/13/23  Yes Becky Augusta, NP  promethazine-dextromethorphan (PROMETHAZINE-DM) 6.25-15 MG/5ML syrup Take 5 mLs by mouth 4 (four) times daily as needed. 05/13/23  Yes Becky Augusta, NP  JUNEL 1/20 1-20 MG-MCG tablet TAKE 1 TABLET BY MOUTH EVERY DAY 05/20/22   Reubin Milan, MD  metaxalone (SKELAXIN) 800 MG tablet Take 1 tablet (800 mg total) by mouth 3 (three) times daily. Take on an empty stomach 03/26/23   Domenick Gong, MD  naproxen  (NAPROSYN) 500 MG tablet Take 1 tablet (500 mg total) by mouth 2 (two) times daily. 03/26/23   Domenick Gong, MD  tiZANidine (ZANAFLEX) 4 MG tablet Take 1 tablet (4 mg total) by mouth every 8 (eight) hours as needed for muscle spasms. 03/26/23   Domenick Gong, MD  valACYclovir (VALTREX) 1000 MG tablet Take 1 tablet (1,000 mg total) by mouth daily. 01/30/22   Reubin Milan, MD    Family History Family History  Problem Relation Age of Onset   Migraines Mother    Hypertension Father     Social History Social History   Tobacco Use   Smoking status: Never   Smokeless tobacco: Never  Vaping Use   Vaping status: Every Day   Substances: Nicotine, Flavoring  Substance Use Topics   Alcohol use: Yes    Comment: rarely   Drug use: No     Allergies   Ciprofloxacin   Review of Systems Review of Systems  Constitutional:  Negative for fever.  HENT:  Positive for sneezing. Negative for congestion and rhinorrhea.   Respiratory:  Positive for cough, shortness of breath and wheezing.      Physical Exam Triage Vital Signs ED Triage Vitals  Encounter Vitals Group     BP 05/13/23 0917 122/80     Systolic BP Percentile --      Diastolic BP Percentile --      Pulse Rate 05/13/23 0917 88     Resp 05/13/23 0917 18  Temp 05/13/23 0917 98.4 F (36.9 C)     Temp Source 05/13/23 0917 Oral     SpO2 05/13/23 0917 100 %     Weight --      Height --      Head Circumference --      Peak Flow --      Pain Score 05/13/23 0915 0     Pain Loc --      Pain Education --      Exclude from Growth Chart --    No data found.  Updated Vital Signs BP 122/80 (BP Location: Right Arm)   Pulse 88   Temp 98.4 F (36.9 C) (Oral)   Resp 18   LMP 04/29/2023 (Approximate)   SpO2 100%   Visual Acuity Right Eye Distance:   Left Eye Distance:   Bilateral Distance:    Right Eye Near:   Left Eye Near:    Bilateral Near:     Physical Exam Vitals and nursing note reviewed.   Constitutional:      Appearance: Normal appearance. She is not ill-appearing.  HENT:     Head: Normocephalic and atraumatic.     Right Ear: Tympanic membrane, ear canal and external ear normal. There is no impacted cerumen.     Left Ear: Tympanic membrane, ear canal and external ear normal. There is no impacted cerumen.     Nose: Nose normal. No congestion or rhinorrhea.     Mouth/Throat:     Mouth: Mucous membranes are moist.     Pharynx: Oropharynx is clear. No oropharyngeal exudate or posterior oropharyngeal erythema.  Cardiovascular:     Rate and Rhythm: Normal rate and regular rhythm.     Pulses: Normal pulses.     Heart sounds: Normal heart sounds. No murmur heard.    No friction rub. No gallop.  Pulmonary:     Effort: Pulmonary effort is normal.     Breath sounds: Normal breath sounds. No wheezing, rhonchi or rales.  Musculoskeletal:     Cervical back: Normal range of motion and neck supple. No tenderness.  Lymphadenopathy:     Cervical: No cervical adenopathy.  Skin:    General: Skin is warm and dry.     Capillary Refill: Capillary refill takes less than 2 seconds.     Findings: No rash.  Neurological:     General: No focal deficit present.     Mental Status: She is alert and oriented to person, place, and time.      UC Treatments / Results  Labs (all labs ordered are listed, but only abnormal results are displayed) Labs Reviewed  RESP PANEL BY RT-PCR (FLU A&B, COVID) ARPGX2 - Abnormal; Notable for the following components:      Result Value   Influenza A by PCR POSITIVE (*)    All other components within normal limits    EKG   Radiology No results found.  Procedures Procedures (including critical care time)  Medications Ordered in UC Medications - No data to display  Initial Impression / Assessment and Plan / UC Course  I have reviewed the triage vital signs and the nursing notes.  Pertinent labs & imaging results that were available during my care  of the patient were reviewed by me and considered in my medical decision making (see chart for details).   Patient is a nontoxic-appearing 48 year old female presenting for evaluation of 3 days with a cough as outlined HPI above.  She is reporting shortness of breath  and wheezing though she is able to speak in full sentences without dyspnea or tachypnea in the exam room.  Respiratory rate at triage is 18 with a 100% room her oxygen saturation.  She is afebrile with oral temp of 98.4.  Physical exam reveals a benign upper respiratory tract.  No cervical lymphadenopathy present on exam.  Cardiopulmonary exam reveals clear lung sounds in all fields.  Given that patient was exposed to influenza a COVID and flu PCR were collected at triage.  Respiratory panel is positive for influenza A.  I will discharge patient on the diagnosis of influenza A.  She is outside the therapeutic window for antivirals given that she is on day 3 of symptoms.  I will prescribe Tessalon Perles and Promethazine DM cough syrup to help her with her cough.   Final Clinical Impressions(s) / UC Diagnoses   Final diagnoses:  Influenza due to identified novel influenza A virus with other respiratory manifestations     Discharge Instructions      You have tested positive for influenza A today.  However, you are on day 3 of symptoms so you are outside the therapeutic window for antivirals.  Please use over-the-counter Tylenol and/or ibuprofen according the package instructions as needed for any fever or pain.  Use the Tessalon Perles every 8 hours during the day as needed for cough.  Take them with a small sip of water.  They may give you numbness to the base of your tongue, or metallic taste in your mouth, this is normal.  Use the Promethazine DM cough syrup as needed for cough and congestion.  I would receive this for bedtime as it does have the potential to make you drowsy.  If you develop any new or worsening symptoms  please return for reevaluation or follow-up with your primary care provider.     ED Prescriptions     Medication Sig Dispense Auth. Provider   benzonatate (TESSALON) 100 MG capsule Take 2 capsules (200 mg total) by mouth every 8 (eight) hours. 21 capsule Becky Augusta, NP   promethazine-dextromethorphan (PROMETHAZINE-DM) 6.25-15 MG/5ML syrup Take 5 mLs by mouth 4 (four) times daily as needed. 118 mL Becky Augusta, NP      PDMP not reviewed this encounter.   Becky Augusta, NP 05/13/23 1021

## 2023-05-13 NOTE — Discharge Instructions (Signed)
You have tested positive for influenza A today.  However, you are on day 3 of symptoms so you are outside the therapeutic window for antivirals.  Please use over-the-counter Tylenol and/or ibuprofen according the package instructions as needed for any fever or pain.  Use the Tessalon Perles every 8 hours during the day as needed for cough.  Take them with a small sip of water.  They may give you numbness to the base of your tongue, or metallic taste in your mouth, this is normal.  Use the Promethazine DM cough syrup as needed for cough and congestion.  I would receive this for bedtime as it does have the potential to make you drowsy.  If you develop any new or worsening symptoms please return for reevaluation or follow-up with your primary care provider.

## 2023-05-13 NOTE — ED Triage Notes (Signed)
Patient presents with non productive cough x 3 days. Patient denies any other symptoms.

## 2023-05-14 ENCOUNTER — Ambulatory Visit: Payer: Self-pay
